# Patient Record
Sex: Female | Born: 1965 | Race: Black or African American | Hispanic: No | Marital: Married | Smoking: Never smoker
Health system: Southern US, Community
[De-identification: ages and names within clinical notes are randomized; demographics above are authoritative.]

## PROBLEM LIST (undated history)

## (undated) DIAGNOSIS — E079 Disorder of thyroid, unspecified: Secondary | ICD-10-CM

## (undated) DIAGNOSIS — R51 Headache: Secondary | ICD-10-CM

## (undated) DIAGNOSIS — I10 Essential (primary) hypertension: Secondary | ICD-10-CM

## (undated) HISTORY — DX: Headache: R51

## (undated) HISTORY — PX: HEMORROIDECTOMY: SUR656

---

## 2001-07-20 HISTORY — PX: VAGINAL HYSTERECTOMY: SUR661

## 2005-06-16 ENCOUNTER — Ambulatory Visit: Payer: Self-pay | Admitting: Gastroenterology

## 2005-07-09 ENCOUNTER — Ambulatory Visit: Payer: Self-pay | Admitting: Gastroenterology

## 2006-10-14 ENCOUNTER — Ambulatory Visit: Payer: Self-pay | Admitting: Gynecology

## 2007-09-30 ENCOUNTER — Emergency Department (HOSPITAL_COMMUNITY): Admission: EM | Admit: 2007-09-30 | Discharge: 2007-09-30 | Payer: Self-pay | Admitting: Emergency Medicine

## 2008-02-22 ENCOUNTER — Ambulatory Visit: Payer: Self-pay | Admitting: Gynecology

## 2008-04-25 ENCOUNTER — Encounter (HOSPITAL_BASED_OUTPATIENT_CLINIC_OR_DEPARTMENT_OTHER): Payer: Self-pay | Admitting: General Surgery

## 2008-04-25 ENCOUNTER — Ambulatory Visit (HOSPITAL_BASED_OUTPATIENT_CLINIC_OR_DEPARTMENT_OTHER): Admission: RE | Admit: 2008-04-25 | Discharge: 2008-04-25 | Payer: Self-pay | Admitting: General Surgery

## 2009-02-06 ENCOUNTER — Ambulatory Visit: Payer: Self-pay | Admitting: Obstetrics and Gynecology

## 2009-02-06 LAB — CONVERTED CEMR LAB
HCT: 40.8 % (ref 36.0–46.0)
Platelets: 179 10*3/uL (ref 150–400)
RBC: 4.58 M/uL (ref 3.87–5.11)
T3, Free: 3 pg/mL (ref 2.3–4.2)
TSH: 0.405 microintl units/mL (ref 0.350–4.500)
WBC: 7.7 10*3/uL (ref 4.0–10.5)

## 2009-02-07 ENCOUNTER — Encounter: Payer: Self-pay | Admitting: Obstetrics and Gynecology

## 2009-02-07 LAB — CONVERTED CEMR LAB
Trich, Wet Prep: NONE SEEN
Yeast Wet Prep HPF POC: NONE SEEN

## 2010-09-23 ENCOUNTER — Ambulatory Visit: Payer: 59 | Admitting: Obstetrics and Gynecology

## 2010-09-23 ENCOUNTER — Encounter: Payer: Self-pay | Admitting: Family Medicine

## 2010-09-23 DIAGNOSIS — R5383 Other fatigue: Secondary | ICD-10-CM

## 2010-09-23 DIAGNOSIS — R5381 Other malaise: Secondary | ICD-10-CM

## 2010-09-23 DIAGNOSIS — Z1272 Encounter for screening for malignant neoplasm of vagina: Secondary | ICD-10-CM

## 2010-09-23 DIAGNOSIS — Z01419 Encounter for gynecological examination (general) (routine) without abnormal findings: Secondary | ICD-10-CM

## 2010-09-23 LAB — CONVERTED CEMR LAB
ALT: 8 units/L (ref 0–35)
AST: 16 units/L (ref 0–37)
Alkaline Phosphatase: 51 units/L (ref 39–117)
Creatinine, Ser: 0.86 mg/dL (ref 0.40–1.20)
HCT: 39.7 % (ref 36.0–46.0)
MCV: 87.6 fL (ref 78.0–100.0)
Platelets: 219 10*3/uL (ref 150–400)
RDW: 12 % (ref 11.5–15.5)
Total Bilirubin: 0.8 mg/dL (ref 0.3–1.2)

## 2010-10-10 NOTE — Assessment & Plan Note (Signed)
Danielle Rodriguez, Danielle Rodriguez              ACCOUNT NO.:  [id number]  MEDICAL RECORD NO.:  [id number]          PATIENT TYPE:  LOCATION:  [facility] at [facility]           FACILITY:  PHYSICIAN:  Tinnie Gens, MD             DATE OF BIRTH:  DATE OF SERVICE:  09/23/2010                                 CLINIC NOTE  HISTORY OF PRESENT ILLNESS:  The patient is a 45 year old para 2 who underwent vaginal hysterectomy for menometrorrhagia in 2003.  She comes in today for her annual exam and desires to have blood work.  She wonders if she has UTI symptom.  She also feels weak and tired many times, wonders if her blood count to be low.  Does not have cycles anymore.  She denies blood in her stool or urine.  She complains of headache if she drinks too much of soda, otherwise feels well.  PAST MEDICAL HISTORY:  Significant for headache only.  PAST SURGICAL HISTORY:  She had a TVH and a hemorrhoidectomy.  MEDICATIONS:  None.  ALLERGIES:  METRONIDAZOLE.  OBSTETRICAL HISTORY:  She had 2 vaginal deliveries.  GYN HISTORY:  She had a TVH for menometrorrhagia, and no history of abnormal Pap smear.  SOCIAL HISTORY:  She works for [employer] from home.  She denies tobacco, alcohol, or drug use.  She has 2 boys, aged 49 and 3.  Her oldest has graduated from college.  Her youngest is currently in division 1 school in [place] playing football there.  FAMILY HISTORY:  She has a brother who has stage IV lung cancer.  He is a former smoker.  Paternal grandmother had breast cancer.  Maternal aunt who recently was deceased had some sort of cancer, and diabetes is a problem in her family as well.  Fourteen-point review of systems is reviewed.  She denies headache, fever, chills, nausea, vomiting, chest pain, shortness of breath, abdominal pain, diarrhea, constipation, blood in her stool, blood in her urine, swelling of feet or ankles.  No masses in her breasts.  PHYSICAL EXAMINATION:  GENERAL:  Today,  she is a well-developed well- nourished female in no acute distress. ABDOMEN:  Soft, nontender, nondistended. HEENT:  Normocephalic, atraumatic.  Sclerae anicteric. NECK:  Supple.  Normal thyroid. LUNGS:  Clear bilaterally. CV:  Regular rate and rhythm.  No rubs, gallops, or murmurs. ABDOMEN:  Soft, nontender, nondistended. EXTREMITIES:  No cyanosis, clubbing, or edema.  2+ distal pulses. BREASTS:  Symmetric with inverted nipples with no masses.  No supraclavicular or axillary adenopathy noted. GENITOURINARY:  Normal external female genitalia.  The uterus is normal. Vagina is pink and rugated.  Uterus and cervix are absent.  There is no pelvic masses noted on bimanual, though the ovaries were not felt as competent.  IMPRESSION:  GYN exam.  PLAN: 1. The patient is scheduled for mammogram already next this month.     Urine culture was done. 2. Lipid panel, CBC, CMP, and TSH were drawn today.          ______________________________ Tinnie Gens, MD    TP/MEDQ  D:  09/23/2010  T:  09/24/2010  Job:  [id number]

## 2010-12-02 NOTE — Assessment & Plan Note (Signed)
Danielle, Rodriguez              ACCOUNT NO.:  [id number]   MEDICAL RECORD NO.:  [id number]          PATIENT TYPE:  POB   LOCATION:  [facility] at [facility]         FACILITY:  [facility]   PHYSICIAN:  Ginger Carne, MD DATE OF BIRTH:  Nov 13, 1965   DATE OF SERVICE:  02/22/2008                                  CLINIC NOTE   Ms. Danielle Rodriguez is here today for routine gynecologic evaluation and  complaint of recurrent hemorrhoids.  She had a total vaginal  hysterectomy in 2002, because of menometrorrhagia.  She has no other GI  complaints apart from hemorrhoids.  No cardiac or GU complaints as well.  The patient takes no medications and has no ongoing medical problems.   SALIENT PHYSICAL FINDINGS:  VITAL SIGNS:  Blood pressure 135/81, weight  157 pounds, height 5 feet 5 inches, and pulse 77 and regular.  HEENT:  Grossly normal.  BREAST:  Without masses, discharge, thickenings or tenderness.  CHEST:  Clear to percussion and auscultation.  CARDIOVASCULAR:  Without murmurs or enlargements.  Regular rate and  rhythm.  EXTREMITY, LYMPHATIC, SKIN, NEUROLOGICAL, MUSCULOSKELETAL SYSTEMS:  Within normal limits.  ABDOMEN:  Soft without gross hepatosplenomegaly.  PELVIC:  EXTERNAL GENITALIA:  Vulva and vagina normal.  Cervix absent.  Both adnexa palpable found to be normal.  No apparent hemorrhoids noted  externally.  However, there is an area at 5 o'clock that appears to be a  boil on the patient states bleeds on a continual basis.   IMPRESSION:  Normal gynecologic exam.   PLAN:  The patient had a normal mammogram in April 2008 and will have  another scheduled in the spring of 2010.  She was asked to call for set  appointment.  In addition, she will be referred to [facility] for dealing with her hemorrhoids.           ______________________________  Ginger Carne, MD     SHB/MEDQ  D:  02/22/2008  T:  02/23/2008  Job:  [id number]

## 2010-12-02 NOTE — Assessment & Plan Note (Signed)
NAMEJAZIAH, Danielle Rodriguez              ACCOUNT NO.:  [id number]   MEDICAL RECORD NO.:  [id number]          PATIENT TYPE:  POB   LOCATION:  [facility] at [facility]         FACILITY:  [facility]   PHYSICIAN:  Argentina Donovan, MD        DATE OF BIRTH:  03-09-1966   DATE OF SERVICE:  02/06/2009                                  CLINIC NOTE   The patient is a 45 year old African American female who had vaginal  hysterectomy several years ago for menometrorrhagia, has been in good  health ever since.  Her chief complaint today are headaches and fatigue.  Her review of systems are negative with exception of those 2 things.  The patient, however, does have a problem of snoring.   PAST MEDICAL HISTORY:  Hysterectomy and banding, hemorrhoidectomy 1 year  ago.  She is due for mammogram today and has scheduled for that.   PHYSICAL EXAMINATION:  GENERAL:  A well-developed, well-nourished  African American female in no acute distress.  VITAL SIGNS:  Weight is 152, height 5 feet 5 inches, blood pressure  110/72, pulse 60 per minute.  HEENT:  Within normal limits.  Thyroid is symmetrical with no masses.  BACK:  Erect.  LUNGS:  Clear to auscultation and percussion.  HEART:  No murmur, normal sinus rhythm.  BREASTS:  Symmetrical, no dominant masses.  No supraclavicular or  axillary nodes.  ABDOMEN:  Soft, flat, nontender.  No masses or organomegaly.  No CVA  tenderness.  EXTREMITIES:  No edema.  Few spider veins, but no significant  varicosities.  NEUROLOGIC:  DTRs within normal limits.  GENITALIA:  External genitalia is normal.  BUS within normal limits.  Vagina is clean, well rugated with somewhat heavy creamy discharges.  Status hysterectomy at the apex, well healed.  Adnexa, no adnexal masses  palpated.  RECTAL:  Negative for masses.   IMPRESSION:  Normal physical examination.  Unilateral headaches of  periodic, often the patient wakes up with them.  We are going to have  her keep a calendar and then make  an appointment to see [facility] for  headache consultation, fatigue.  I am going to check CBC and get a  thyroid profile on the patient, but in view of the snoring, she may have  a problem with sleep apnea and I have told her if those are negative  that she probably will going to get a sleep apnea evaluation.  General  physical examination otherwise normal.           ______________________________  Argentina Donovan, MD     PR/MEDQ  D:  02/06/2009  T:  02/06/2009  Job:  [id number]

## 2010-12-02 NOTE — Op Note (Signed)
Danielle Rodriguez, Danielle Rodriguez              ACCOUNT NO.:  [id number]   MEDICAL RECORD NO.:  [id number]          PATIENT TYPE:  AMB   LOCATION:  [facility]                          FACILITY:  [facility]   PHYSICIAN:  Leonie Man, M.D.   DATE OF BIRTH:  05-Dec-1965   DATE OF PROCEDURE:  04/25/2008  DATE OF DISCHARGE:                               OPERATIVE REPORT   PREOPERATIVE DIAGNOSES:  1. Anal fistula.  2. External hemorrhoids.   POSTOPERATIVE DIAGNOSES:  1. Anal fistula.  2. External hemorrhoids.   PROCEDURES:  1. Anal fistulotomy.  2. External hemorrhoidectomy.   SURGEON:  Leonie Man, MD   ASSISTANT:  OR nurse.   ANESTHESIA:  General.   POSITION:  Prone.   SPECIMEN TO LABORATORY:  Hemorrhoids.   FINDINGS:  1. Superficial transsphincteric fistula.  2. External hemorrhoids.   COMPLICATIONS:  None.   The patient returned to the PACU in excellent condition.   Danielle Rodriguez is a 45 year old lady with persistent draining from the  perianal area.  She has no specific history of a perianal abscess.  On  examination, there is a fistula which traverses the external sphincter  at approximately 5% of the sphincter.  She also has some difficulty with  hygiene due to noninflamed external hemorrhoids, one in the posterior  midline and one in the anterior midline.  She comes to the operating  room now for fistulotomy and hemorrhoidectomy after the risks and  potential benefits of surgery have been fully discussed.  All questions  have been answered, consent for the surgery obtained.   Following the induction of satisfactory anesthesia with the patient in  the prone jackknife position, the perianal tissues were prepped and  draped to be included in a sterile operative field.  A surgical  precaution protocol identified the patient as Danielle Rodriguez and  procedures to be done as fistulotomy and hemorrhoidectomy, concurrence  from all members of the operating team was obtained.   I dilated  the anus up to 3 fingerbreadths at the external opening of the  fistula, which was located anteriorly, and laterally on the patient's  right side, I inserted a probe, which easily passed into the anal canal.  Using electrocautery, I opened up the fistula along the course of the  probe.  I cauterized the edges and marsupialized the edges of the  fistula to the base of the fistula with a running 3-0 chromic catgut  suture.   Attention then turned to the external hemorrhoids.  The larger of the  hemorrhoids, which was in the anterior midline was infiltrated with 0.5%  Marcaine with epinephrine.  I placed a suture at the base of the  hemorrhoid and just below the dentate line and suture ligated at its  base.  I used electrocautery to remove the hemorrhoid, dissected free  from the underlying external sphincter.  Hemostasis was obtained and the  resulting wound closed with a running suture of 3-0 chromic catgut.   Similarly, hemorrhoid on the posterior midline was similarly treated by  infiltrating with 0.5% Marcaine placing a suture at the base of the  hemorrhoid  and dissecting the hemorrhoid off of the external sphincter  and closing the wound with a running 3-0 chromic catgut.  All areas of  dissection checked for hemostasis and noted to be dry.  I placed a  Xeroform gauze over the now marsupialized fistulous tract.  I placed  Gelfoam gauze over the hemorrhoidal suture lines.  The anesthetic  reversed.  A sterile dressing placed across the anus, and the patient  was moved from the operating room to the recovery room in stable  condition.  She tolerated the procedure well.      Leonie Man, M.D.  Electronically Signed     PB/MEDQ  D:  04/25/2008  T:  04/25/2008  Job:  [id number]

## 2011-04-21 LAB — DIFFERENTIAL
Basophils Absolute: 0
Basophils Relative: 0
Eosinophils Relative: 2
Lymphocytes Relative: 28
Monocytes Absolute: 0.6

## 2011-04-21 LAB — POCT I-STAT, CHEM 8
BUN: 5 — ABNORMAL LOW
Calcium, Ion: 1.16
TCO2: 29

## 2011-04-21 LAB — CBC
HCT: 43
Hemoglobin: 15
MCHC: 34.8
MCV: 89.7
RDW: 12.7

## 2011-05-11 ENCOUNTER — Other Ambulatory Visit (INDEPENDENT_AMBULATORY_CARE_PROVIDER_SITE_OTHER): Payer: 59

## 2011-05-11 DIAGNOSIS — N390 Urinary tract infection, site not specified: Secondary | ICD-10-CM

## 2011-05-11 DIAGNOSIS — N760 Acute vaginitis: Secondary | ICD-10-CM

## 2011-05-11 DIAGNOSIS — A499 Bacterial infection, unspecified: Secondary | ICD-10-CM

## 2011-05-11 LAB — POCT URINALYSIS DIPSTICK
Ketones, UA: NEGATIVE
Protein, UA: NEGATIVE
pH, UA: NEGATIVE

## 2011-05-11 MED ORDER — LEVOFLOXACIN 500 MG PO TABS: 500.0000 mg | ORAL_TABLET | Freq: Every day | ORAL | Status: AC

## 2011-05-11 MED ORDER — CLINDAMYCIN PHOSPHATE 100 MG VA SUPP: 100.0000 mg | Freq: Every day | VAGINAL | Status: AC

## 2011-05-11 NOTE — Progress Notes (Signed)
Patient is here today for increased odor and low pelvic pressure with urination.  She also is having a slight discharge that has a foul odor.  Urine dip is showing 1+ blood and 2+ leukocytes.  Otherwise it is negative.  I will call in Cleocin gel as she is allergic to Metronidazole.  I will also call in Levaquin for her uti.

## 2011-05-11 NOTE — Progress Notes (Signed)
Addended by: Barbara Cower on: 05/11/2011 01:20 PM   Modules accepted: Orders

## 2011-05-11 NOTE — Progress Notes (Signed)
Addended by: Barbara Cower on: 05/11/2011 09:44 AM   Modules accepted: Orders

## 2011-10-27 ENCOUNTER — Ambulatory Visit (INDEPENDENT_AMBULATORY_CARE_PROVIDER_SITE_OTHER): Payer: 59 | Admitting: Family Medicine

## 2011-10-27 ENCOUNTER — Encounter: Payer: Self-pay | Admitting: Family Medicine

## 2011-10-27 DIAGNOSIS — Z0000 Encounter for general adult medical examination without abnormal findings: Secondary | ICD-10-CM

## 2011-10-27 DIAGNOSIS — R300 Dysuria: Secondary | ICD-10-CM

## 2011-10-27 LAB — POCT URINALYSIS DIPSTICK
Bilirubin, UA: NEGATIVE
Blood, UA: NEGATIVE
Glucose, UA: NEGATIVE
Ketones, UA: NEGATIVE
pH, UA: 6

## 2011-10-27 MED ORDER — SULFAMETHOXAZOLE-TMP DS 800-160 MG PO TABS: 1.0000 | ORAL_TABLET | Freq: Two times a day (BID) | ORAL | Status: DC

## 2011-10-27 NOTE — Patient Instructions (Signed)
Preventive Care for Adults, Female A healthy lifestyle and preventive care can promote health and wellness. Preventive health guidelines for women include the following key practices.  A routine yearly physical is a good way to check with your caregiver about your health and preventive screening. It is a chance to share any concerns and updates on your health, and to receive a thorough exam.   Visit your dentist for a routine exam and preventive care every 6 months. Brush your teeth twice a day and floss once a day. Good oral hygiene prevents tooth decay and gum disease.   The frequency of eye exams is based on your age, health, family medical history, use of contact lenses, and other factors. Follow your caregiver's recommendations for frequency of eye exams.   Eat a healthy diet. Foods like vegetables, fruits, whole grains, low-fat dairy products, and lean protein foods contain the nutrients you need without too many calories. Decrease your intake of foods high in solid fats, added sugars, and salt. Eat the right amount of calories for you.Get information about a proper diet from your caregiver, if necessary.   Regular physical exercise is one of the most important things you can do for your health. Most adults should get at least 150 minutes of moderate-intensity exercise (any activity that increases your heart rate and causes you to sweat) each week. In addition, most adults need muscle-strengthening exercises on 2 or more days a week.   Maintain a healthy weight. The body mass index (BMI) is a screening tool to identify possible weight problems. It provides an estimate of body fat based on height and weight. Your caregiver can help determine your BMI, and can help you achieve or maintain a healthy weight.For adults 20 years and older:   A BMI below 18.5 is considered underweight.   A BMI of 18.5 to 24.9 is normal.   A BMI of 25 to 29.9 is considered overweight.   A BMI of 30 and above is  considered obese.   Maintain normal blood lipids and cholesterol levels by exercising and minimizing your intake of saturated fat. Eat a balanced diet with plenty of fruit and vegetables. Blood tests for lipids and cholesterol should begin at age 20 and be repeated every 5 years. If your lipid or cholesterol levels are high, you are over 50, or you are at high risk for heart disease, you may need your cholesterol levels checked more frequently.Ongoing high lipid and cholesterol levels should be treated with medicines if diet and exercise are not effective.   If you smoke, find out from your caregiver how to quit. If you do not use tobacco, do not start.   If you are pregnant, do not drink alcohol. If you are breastfeeding, be very cautious about drinking alcohol. If you are not pregnant and choose to drink alcohol, do not exceed 1 drink per day. One drink is considered to be 12 ounces (355 mL) of beer, 5 ounces (148 mL) of wine, or 1.5 ounces (44 mL) of liquor.   Avoid use of street drugs. Do not share needles with anyone. Ask for help if you need support or instructions about stopping the use of drugs.   High blood pressure causes heart disease and increases the risk of stroke. Your blood pressure should be checked at least every 1 to 2 years. Ongoing high blood pressure should be treated with medicines if weight loss and exercise are not effective.   If you are 55 to 48   years old, ask your caregiver if you should take aspirin to prevent strokes.   Diabetes screening involves taking a blood sample to check your fasting blood sugar level. This should be done once every 3 years, after age 45, if you are within normal weight and without risk factors for diabetes. Testing should be considered at a younger age or be carried out more frequently if you are overweight and have at least 1 risk factor for diabetes.   Breast cancer screening is essential preventive care for women. You should practice "breast  self-awareness." This means understanding the normal appearance and feel of your breasts and may include breast self-examination. Any changes detected, no matter how small, should be reported to a caregiver. Women in their 20s and 30s should have a clinical breast exam (CBE) by a caregiver as part of a regular health exam every 1 to 3 years. After age 40, women should have a CBE every year. Starting at age 40, women should consider having a mammography (breast X-ray test) every year. Women who have a family history of breast cancer should talk to their caregiver about genetic screening. Women at a high risk of breast cancer should talk to their caregivers about having magnetic resonance imaging (MRI) and a mammography every year.   The Pap test is a screening test for cervical cancer. A Pap test can show cell changes on the cervix that might become cervical cancer if left untreated. A Pap test is a procedure in which cells are obtained and examined from the lower end of the uterus (cervix).   Women should have a Pap test starting at age 21.   Between ages 21 and 29, Pap tests should be repeated every 2 years.   Beginning at age 30, you should have a Pap test every 3 years as long as the past 3 Pap tests have been normal.   Some women have medical problems that increase the chance of getting cervical cancer. Talk to your caregiver about these problems. It is especially important to talk to your caregiver if a new problem develops soon after your last Pap test. In these cases, your caregiver may recommend more frequent screening and Pap tests.   The above recommendations are the same for women who have or have not gotten the vaccine for human papillomavirus (HPV).   If you had a hysterectomy for a problem that was not cancer or a condition that could lead to cancer, then you no longer need Pap tests. Even if you no longer need a Pap test, a regular exam is a good idea to make sure no other problems are  starting.   If you are between ages 65 and 70, and you have had normal Pap tests going back 10 years, you no longer need Pap tests. Even if you no longer need a Pap test, a regular exam is a good idea to make sure no other problems are starting.   If you have had past treatment for cervical cancer or a condition that could lead to cancer, you need Pap tests and screening for cancer for at least 20 years after your treatment.   If Pap tests have been discontinued, risk factors (such as a new sexual partner) need to be reassessed to determine if screening should be resumed.   The HPV test is an additional test that may be used for cervical cancer screening. The HPV test looks for the virus that can cause the cell changes on the cervix.   The cells collected during the Pap test can be tested for HPV. The HPV test could be used to screen women aged 30 years and older, and should be used in women of any age who have unclear Pap test results. After the age of 30, women should have HPV testing at the same frequency as a Pap test.   Colorectal cancer can be detected and often prevented. Most routine colorectal cancer screening begins at the age of 50 and continues through age 75. However, your caregiver may recommend screening at an earlier age if you have risk factors for colon cancer. On a yearly basis, your caregiver may provide home test kits to check for hidden blood in the stool. Use of a small camera at the end of a tube, to directly examine the colon (sigmoidoscopy or colonoscopy), can detect the earliest forms of colorectal cancer. Talk to your caregiver about this at age 50, when routine screening begins. Direct examination of the colon should be repeated every 5 to 10 years through age 75, unless early forms of pre-cancerous polyps or small growths are found.   Hepatitis C blood testing is recommended for all people born from 1945 through 1965 and any individual with known risks for hepatitis C.    Practice safe sex. Use condoms and avoid high-risk sexual practices to reduce the spread of sexually transmitted infections (STIs). STIs include gonorrhea, chlamydia, syphilis, trichomonas, herpes, HPV, and human immunodeficiency virus (HIV). Herpes, HIV, and HPV are viral illnesses that have no cure. They can result in disability, cancer, and death. Sexually active women aged 25 and younger should be checked for chlamydia. Older women with new or multiple partners should also be tested for chlamydia. Testing for other STIs is recommended if you are sexually active and at increased risk.   Osteoporosis is a disease in which the bones lose minerals and strength with aging. This can result in serious bone fractures. The risk of osteoporosis can be identified using a bone density scan. Women ages 65 and over and women at risk for fractures or osteoporosis should discuss screening with their caregivers. Ask your caregiver whether you should take a calcium supplement or vitamin D to reduce the rate of osteoporosis.   Menopause can be associated with physical symptoms and risks. Hormone replacement therapy is available to decrease symptoms and risks. You should talk to your caregiver about whether hormone replacement therapy is right for you.   Use sunscreen with sun protection factor (SPF) of 30 or more. Apply sunscreen liberally and repeatedly throughout the day. You should seek shade when your shadow is shorter than you. Protect yourself by wearing long sleeves, pants, a wide-brimmed hat, and sunglasses year round, whenever you are outdoors.   Once a month, do a whole body skin exam, using a mirror to look at the skin on your back. Notify your caregiver of new moles, moles that have irregular borders, moles that are larger than a pencil eraser, or moles that have changed in shape or color.   Stay current with required immunizations.   Influenza. You need a dose every fall (or winter). The composition of  the flu vaccine changes each year, so being vaccinated once is not enough.   Pneumococcal polysaccharide. You need 1 to 2 doses if you smoke cigarettes or if you have certain chronic medical conditions. You need 1 dose at age 65 (or older) if you have never been vaccinated.   Tetanus, diphtheria, pertussis (Tdap, Td). Get 1 dose of   Tdap vaccine if you are younger than age 65, are over 65 and have contact with an infant, are a healthcare worker, are pregnant, or simply want to be protected from whooping cough. After that, you need a Td booster dose every 10 years. Consult your caregiver if you have not had at least 3 tetanus and diphtheria-containing shots sometime in your life or have a deep or dirty wound.   HPV. You need this vaccine if you are a woman age 26 or younger. The vaccine is given in 3 doses over 6 months.   Measles, mumps, rubella (MMR). You need at least 1 dose of MMR if you were born in 1957 or later. You may also need a second dose.   Meningococcal. If you are age 19 to 21 and a first-year college student living in a residence hall, or have one of several medical conditions, you need to get vaccinated against meningococcal disease. You may also need additional booster doses.   Zoster (shingles). If you are age 60 or older, you should get this vaccine.   Varicella (chickenpox). If you have never had chickenpox or you were vaccinated but received only 1 dose, talk to your caregiver to find out if you need this vaccine.   Hepatitis A. You need this vaccine if you have a specific risk factor for hepatitis A virus infection or you simply wish to be protected from this disease. The vaccine is usually given as 2 doses, 6 to 18 months apart.   Hepatitis B. You need this vaccine if you have a specific risk factor for hepatitis B virus infection or you simply wish to be protected from this disease. The vaccine is given in 3 doses, usually over 6 months.  Preventive Services /  Frequency Ages 19 to 39  Blood pressure check.** / Every 1 to 2 years.   Lipid and cholesterol check.** / Every 5 years beginning at age 20.   Clinical breast exam.** / Every 3 years for women in their 20s and 30s.   Pap test.** / Every 2 years from ages 21 through 29. Every 3 years starting at age 30 through age 65 or 70 with a history of 3 consecutive normal Pap tests.   HPV screening.** / Every 3 years from ages 30 through ages 65 to 70 with a history of 3 consecutive normal Pap tests.   Hepatitis C blood test.** / For any individual with known risks for hepatitis C.   Skin self-exam. / Monthly.   Influenza immunization.** / Every year.   Pneumococcal polysaccharide immunization.** / 1 to 2 doses if you smoke cigarettes or if you have certain chronic medical conditions.   Tetanus, diphtheria, pertussis (Tdap, Td) immunization. / A one-time dose of Tdap vaccine. After that, you need a Td booster dose every 10 years.   HPV immunization. / 3 doses over 6 months, if you are 26 and younger.   Measles, mumps, rubella (MMR) immunization. / You need at least 1 dose of MMR if you were born in 1957 or later. You may also need a second dose.   Meningococcal immunization. / 1 dose if you are age 19 to 21 and a first-year college student living in a residence hall, or have one of several medical conditions, you need to get vaccinated against meningococcal disease. You may also need additional booster doses.   Varicella immunization.** / Consult your caregiver.   Hepatitis A immunization.** / Consult your caregiver. 2 doses, 6 to 18 months   apart.   Hepatitis B immunization.** / Consult your caregiver. 3 doses usually over 6 months.  Ages 40 to 64  Blood pressure check.** / Every 1 to 2 years.   Lipid and cholesterol check.** / Every 5 years beginning at age 20.   Clinical breast exam.** / Every year after age 40.   Mammogram.** / Every year beginning at age 40 and continuing for as  long as you are in good health. Consult with your caregiver.   Pap test.** / Every 3 years starting at age 30 through age 65 or 70 with a history of 3 consecutive normal Pap tests.   HPV screening.** / Every 3 years from ages 30 through ages 65 to 70 with a history of 3 consecutive normal Pap tests.   Fecal occult blood test (FOBT) of stool. / Every year beginning at age 50 and continuing until age 75. You may not need to do this test if you get a colonoscopy every 10 years.   Flexible sigmoidoscopy or colonoscopy.** / Every 5 years for a flexible sigmoidoscopy or every 10 years for a colonoscopy beginning at age 50 and continuing until age 75.   Hepatitis C blood test.** / For all people born from 1945 through 1965 and any individual with known risks for hepatitis C.   Skin self-exam. / Monthly.   Influenza immunization.** / Every year.   Pneumococcal polysaccharide immunization.** / 1 to 2 doses if you smoke cigarettes or if you have certain chronic medical conditions.   Tetanus, diphtheria, pertussis (Tdap, Td) immunization.** / A one-time dose of Tdap vaccine. After that, you need a Td booster dose every 10 years.   Measles, mumps, rubella (MMR) immunization. / You need at least 1 dose of MMR if you were born in 1957 or later. You may also need a second dose.   Varicella immunization.** / Consult your caregiver.   Meningococcal immunization.** / Consult your caregiver.   Hepatitis A immunization.** / Consult your caregiver. 2 doses, 6 to 18 months apart.   Hepatitis B immunization.** / Consult your caregiver. 3 doses, usually over 6 months.  Ages 65 and over  Blood pressure check.** / Every 1 to 2 years.   Lipid and cholesterol check.** / Every 5 years beginning at age 20.   Clinical breast exam.** / Every year after age 40.   Mammogram.** / Every year beginning at age 40 and continuing for as long as you are in good health. Consult with your caregiver.   Pap test.** /  Every 3 years starting at age 30 through age 65 or 70 with a 3 consecutive normal Pap tests. Testing can be stopped between 65 and 70 with 3 consecutive normal Pap tests and no abnormal Pap or HPV tests in the past 10 years.   HPV screening.** / Every 3 years from ages 30 through ages 65 or 70 with a history of 3 consecutive normal Pap tests. Testing can be stopped between 65 and 70 with 3 consecutive normal Pap tests and no abnormal Pap or HPV tests in the past 10 years.   Fecal occult blood test (FOBT) of stool. / Every year beginning at age 50 and continuing until age 75. You may not need to do this test if you get a colonoscopy every 10 years.   Flexible sigmoidoscopy or colonoscopy.** / Every 5 years for a flexible sigmoidoscopy or every 10 years for a colonoscopy beginning at age 50 and continuing until age 75.   Hepatitis   C blood test.** / For all people born from 1945 through 1965 and any individual with known risks for hepatitis C.   Osteoporosis screening.** / A one-time screening for women ages 65 and over and women at risk for fractures or osteoporosis.   Skin self-exam. / Monthly.   Influenza immunization.** / Every year.   Pneumococcal polysaccharide immunization.** / 1 dose at age 65 (or older) if you have never been vaccinated.   Tetanus, diphtheria, pertussis (Tdap, Td) immunization. / A one-time dose of Tdap vaccine if you are over 65 and have contact with an infant, are a healthcare worker, or simply want to be protected from whooping cough. After that, you need a Td booster dose every 10 years.   Varicella immunization.** / Consult your caregiver.   Meningococcal immunization.** / Consult your caregiver.   Hepatitis A immunization.** / Consult your caregiver. 2 doses, 6 to 18 months apart.   Hepatitis B immunization.** / Check with your caregiver. 3 doses, usually over 6 months.  ** Family history and personal history of risk and conditions may change your caregiver's  recommendations. Document Released: 09/01/2001 Document Revised: 06/25/2011 Document Reviewed: 12/01/2010 [employer] Patient Information 2012 [employer]. 

## 2011-10-27 NOTE — Progress Notes (Signed)
Addended by: Barbara Cower on: 10/27/2011 05:13 PM   Modules accepted: Orders

## 2011-10-27 NOTE — Progress Notes (Signed)
  Subjective:     Danielle Rodriguez is a 46 y.o. female and is here for a comprehensive physical exam. The patient reports no problems. C/o burning with urination. No h/o abnl pap and s/p TVH. History   Social History  . Marital Status: Married    Spouse Name: N/A    Number of Children: N/A  . Years of Education: N/A   Occupational History  . Not on file.   Social History Main Topics  . Smoking status: Never Smoker   . Smokeless tobacco: Not on file  . Alcohol Use: No  . Drug Use: No  . Sexually Active: Yes -- Female partner(s)    Birth Control/ Protection: Surgical     hysterectomy   Other Topics Concern  . Not on file   Social History Narrative  . No narrative on file   Health Maintenance  Topic Date Due  . Pap Smear  11/27/1981  . Tetanus/tdap  11/28/1982  . Influenza Vaccine  04/19/2012    The following portions of the patient's history were reviewed and updated as appropriate: allergies, current medications, past family history, past medical history, past social history, past surgical history and problem list.  Review of Systems A comprehensive review of systems was negative.   Objective:    BP 124/72  Pulse 71  Ht 5\' 3"  (1.6 m)  Wt 163 lb (73.936 kg)  BMI 28.87 kg/m2 General appearance: alert, cooperative and appears stated age Head: Normocephalic, without obvious abnormality, atraumatic Neck: no adenopathy, supple, symmetrical, trachea midline and thyroid not enlarged, symmetric, no tenderness/mass/nodules Lungs: clear to auscultation bilaterally Breasts: normal appearance, no masses or tenderness Heart: regular rate and rhythm, S1, S2 normal, no murmur, click, rub or gallop Abdomen: soft, non-tender; bowel sounds normal; no masses,  no organomegaly Pelvic: external genitalia normal, no adnexal masses or tenderness, uterus surgically absent and vagina normal without discharge Extremities: extremities normal, atraumatic, no cyanosis or edema Pulses: 2+ and  symmetric Skin: Skin color, texture, turgor normal. No rashes or lesions Lymph nodes: Cervical, supraclavicular, and axillary nodes normal. Neurologic: Grossly normal   UA shows nitrite and LE--will send for culture Assessment:    Healthy female exam.      Plan:    Mammogram Annual blood work No pap needed. See After Visit Summary for Counseling Recommendations

## 2011-10-28 LAB — CBC
HCT: 39.7 % (ref 36.0–46.0)
Hemoglobin: 12.9 g/dL (ref 12.0–15.0)
RBC: 4.39 MIL/uL (ref 3.87–5.11)
WBC: 6.8 10*3/uL (ref 4.0–10.5)

## 2011-10-28 LAB — COMPREHENSIVE METABOLIC PANEL
Albumin: 4.5 g/dL (ref 3.5–5.2)
BUN: 11 mg/dL (ref 6–23)
CO2: 29 mEq/L (ref 19–32)
Calcium: 9 mg/dL (ref 8.4–10.5)
Chloride: 103 mEq/L (ref 96–112)
Glucose, Bld: 96 mg/dL (ref 70–99)
Potassium: 3.9 mEq/L (ref 3.5–5.3)

## 2011-10-28 LAB — LIPID PANEL
HDL: 68 mg/dL (ref >39)
Triglycerides: 76 mg/dL (ref <150)

## 2011-10-28 LAB — TSH: TSH: 0.611 u[IU]/mL (ref 0.350–4.500)

## 2011-10-31 LAB — URINE CULTURE: Colony Count: >=100000

## 2012-06-29 ENCOUNTER — Ambulatory Visit: Payer: 59 | Admitting: Obstetrics and Gynecology

## 2012-06-29 ENCOUNTER — Encounter: Payer: Self-pay | Admitting: Obstetrics and Gynecology

## 2012-06-29 ENCOUNTER — Ambulatory Visit (INDEPENDENT_AMBULATORY_CARE_PROVIDER_SITE_OTHER): Payer: 59 | Admitting: Obstetrics and Gynecology

## 2012-06-29 VITALS — BP 145/88 | HR 71 | Ht 64.0 in | Wt 165.0 lb

## 2012-06-29 DIAGNOSIS — N760 Acute vaginitis: Secondary | ICD-10-CM

## 2012-06-29 DIAGNOSIS — B9689 Other specified bacterial agents as the cause of diseases classified elsewhere: Secondary | ICD-10-CM

## 2012-06-29 DIAGNOSIS — R300 Dysuria: Secondary | ICD-10-CM

## 2012-06-29 DIAGNOSIS — A499 Bacterial infection, unspecified: Secondary | ICD-10-CM

## 2012-06-29 DIAGNOSIS — N390 Urinary tract infection, site not specified: Secondary | ICD-10-CM

## 2012-06-29 LAB — POCT URINALYSIS DIPSTICK
Bilirubin, UA: NEGATIVE
Glucose, UA: NEGATIVE
Nitrite, UA: POSITIVE
Urobilinogen, UA: NEGATIVE
pH, UA: 6

## 2012-06-29 MED ORDER — SULFAMETHOXAZOLE-TMP DS 800-160 MG PO TABS: 1.0000 | ORAL_TABLET | Freq: Two times a day (BID) | ORAL | Status: AC

## 2012-06-29 NOTE — Progress Notes (Signed)
Patient ID: Danielle Rodriguez, female   DOB: Dec 22, 1965, 46 y.o.   MRN: [id number] 46 yo G2P2 presenting today for evaluation of dark urine and frequency. Patient denies dysuria.  UA is positive for nitrite  Patient has had two positive E.coli UTI since 04/2011- Reminded patient of good perineal hygiene and to keep herself well hydrated.  Will Prescribe bactrim. Cultures sent.

## 2012-07-01 LAB — CULTURE, URINE COMPREHENSIVE

## 2012-09-29 ENCOUNTER — Other Ambulatory Visit (INDEPENDENT_AMBULATORY_CARE_PROVIDER_SITE_OTHER): Payer: 59

## 2012-09-29 ENCOUNTER — Telehealth: Payer: Self-pay | Admitting: *Deleted

## 2012-09-29 DIAGNOSIS — R300 Dysuria: Secondary | ICD-10-CM

## 2012-09-29 LAB — POCT URINALYSIS DIPSTICK
Bilirubin, UA: NEGATIVE
Glucose, UA: NEGATIVE
Ketones, UA: NEGATIVE
Leukocytes, UA: NEGATIVE
Nitrite, UA: POSITIVE
pH, UA: 5

## 2012-09-29 NOTE — Telephone Encounter (Signed)
Called pt to adv still waiting on urine culture but will call in abx per Dr. Shawnie Pons. Pt states not pregnant and not allergic to Cipro - I adv will call into Cipro into [facility] in [place] [employer] - she will pick up

## 2012-09-29 NOTE — Telephone Encounter (Signed)
Called [facility] - Spoke to "pharmacist" gave order. 500mg  Cipro bid x3days

## 2012-10-01 LAB — CULTURE, URINE COMPREHENSIVE

## 2012-10-31 ENCOUNTER — Ambulatory Visit (INDEPENDENT_AMBULATORY_CARE_PROVIDER_SITE_OTHER): Payer: 59 | Admitting: Obstetrics & Gynecology

## 2012-10-31 ENCOUNTER — Encounter: Payer: Self-pay | Admitting: Obstetrics & Gynecology

## 2012-10-31 VITALS — BP 134/82 | HR 90 | Resp 16 | Ht 64.0 in | Wt 161.0 lb

## 2012-10-31 DIAGNOSIS — N390 Urinary tract infection, site not specified: Secondary | ICD-10-CM

## 2012-10-31 DIAGNOSIS — Z01419 Encounter for gynecological examination (general) (routine) without abnormal findings: Secondary | ICD-10-CM

## 2012-10-31 DIAGNOSIS — Z139 Encounter for screening, unspecified: Secondary | ICD-10-CM

## 2012-10-31 DIAGNOSIS — Z0000 Encounter for general adult medical examination without abnormal findings: Secondary | ICD-10-CM

## 2012-10-31 LAB — POCT URINALYSIS DIPSTICK
Bilirubin, UA: NEGATIVE
Blood, UA: NEGATIVE
Glucose, UA: NEGATIVE
Ketones, UA: NEGATIVE
Protein, UA: 0.2
Spec Grav, UA: 1.02
pH, UA: 5

## 2012-10-31 NOTE — Progress Notes (Signed)
Subjective:    Danielle Rodriguez is a 47 y.o. female who presents for an annual exam. The patient has no complaints today. The patient is sexually active. GYN screening history: last pap: was normal. The patient wears seatbelts: yes. The patient participates in regular exercise: no. Has the patient ever been transfused or tattooed?: no. The patient reports that there is not domestic violence in her life.   Menstrual History: OB History   Grav Para Term Preterm Abortions TAB SAB Ect Mult Living   2 2 2       2       Menarche age: 74  No LMP recorded. Patient has had a hysterectomy.    The following portions of the patient's history were reviewed and updated as appropriate: allergies, current medications, past family history, past medical history, past social history, past surgical history and problem list.  Review of Systems A comprehensive review of systems was negative. Married for 14 years, denies dyspareunia, no need for lubricant, denies hot flashes. 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 She has 70 and 36 yo sons. She had a mammogram today.   Objective:    BP 134/82  Pulse 90  Resp 16  Ht 5\' 4"  (1.626 m)  Wt 161 lb (73.029 kg)  BMI 27.62 kg/m2  General Appearance:    Alert, cooperative, no distress, appears stated age  Head:    Normocephalic, without obvious abnormality, atraumatic  Eyes:    PERRL, conjunctiva/corneas clear, EOM's intact, fundi    benign, both eyes  Ears:    Normal TM's and external ear canals, both ears  Nose:   Nares normal, septum midline, mucosa normal, no drainage    or sinus tenderness  Throat:   Lips, mucosa, and tongue normal; teeth and gums normal  Neck:   Supple, symmetrical, trachea midline, no adenopathy;    thyroid:  no enlargement/tenderness/nodules; no carotid   bruit or JVD  Back:     Symmetric, no curvature, ROM normal, no CVA tenderness  Lungs:     Clear to auscultation bilaterally, respirations unlabored  Chest Wall:    No  tenderness or deformity   Heart:    Regular rate and rhythm, S1 and S2 normal, no murmur, rub   or gallop  Breast Exam:    No tenderness, masses, or nipple abnormality  Abdomen:     Soft, non-tender, bowel sounds active all four quadrants,    no masses, no organomegaly  Genitalia:    Normal female without lesion, discharge or tenderness, Normal vaginal discharge, normal adnexal exam     Extremities:   Extremities normal, atraumatic, no cyanosis or edema  Pulses:   2+ and symmetric all extremities  Skin:   Skin color, texture, turgor normal, no rashes or lesions  Lymph nodes:   Cervical, supraclavicular, and axillary nodes normal  Neurologic:   CNII-XII intact, normal strength, sensation and reflexes    throughout  .    Assessment:    Healthy female exam.  Recurrent UTIs   Plan:     Breast self exam technique reviewed and patient encouraged to perform self-exam monthly.  Lab tests when she is fasting Urology referral for UTI recurrence

## 2013-12-22 ENCOUNTER — Encounter: Payer: Self-pay | Admitting: Family Medicine

## 2013-12-22 ENCOUNTER — Ambulatory Visit (INDEPENDENT_AMBULATORY_CARE_PROVIDER_SITE_OTHER): Payer: 59 | Admitting: Family Medicine

## 2013-12-22 VITALS — BP 137/82 | HR 70 | Ht 64.0 in | Wt 159.2 lb

## 2013-12-22 DIAGNOSIS — R8290 Unspecified abnormal findings in urine: Secondary | ICD-10-CM

## 2013-12-22 DIAGNOSIS — R82998 Other abnormal findings in urine: Secondary | ICD-10-CM

## 2013-12-22 DIAGNOSIS — N390 Urinary tract infection, site not specified: Secondary | ICD-10-CM

## 2013-12-22 DIAGNOSIS — N898 Other specified noninflammatory disorders of vagina: Secondary | ICD-10-CM

## 2013-12-22 DIAGNOSIS — Z01419 Encounter for gynecological examination (general) (routine) without abnormal findings: Secondary | ICD-10-CM

## 2013-12-22 LAB — POCT URINALYSIS DIPSTICK
Bilirubin, UA: NEGATIVE
GLUCOSE UA: NEGATIVE
Ketones, UA: ABSENT
NITRITE UA: POSITIVE
PROTEIN UA: ABSENT
Spec Grav, UA: >=1.03
UROBILINOGEN UA: NEGATIVE
pH, UA: 6

## 2013-12-22 LAB — CBC
HCT: 39.2 % (ref 36.0–46.0)
Hemoglobin: 13.7 g/dL (ref 12.0–15.0)
MCH: 29.8 pg (ref 26.0–34.0)
MCHC: 34.9 g/dL (ref 30.0–36.0)
MCV: 85.2 fL (ref 78.0–100.0)
PLATELETS: 186 10*3/uL (ref 150–400)
RBC: 4.6 MIL/uL (ref 3.87–5.11)
RDW: 13.7 % (ref 11.5–15.5)
WBC: 8.1 10*3/uL (ref 4.0–10.5)

## 2013-12-22 MED ORDER — CIPROFLOXACIN HCL 500 MG PO TABS: 500.0000 mg | ORAL_TABLET | Freq: Two times a day (BID) | ORAL | Status: DC

## 2013-12-22 MED ORDER — FLUCONAZOLE 150 MG PO TABS: 150.0000 mg | ORAL_TABLET | Freq: Every day | ORAL | Status: DC

## 2013-12-22 NOTE — Progress Notes (Signed)
Patient is having increased odor with urine and also having increased cottage cheese like discharge.  She is here today for a yearly exam as well.  No other concerns.

## 2013-12-22 NOTE — Progress Notes (Signed)
  Subjective:     Danielle Rodriguez is a 48 y.o. female and is here for a comprehensive physical exam. The patient reports problems - urinary odor and vaginal discharge. Previous hysterectomy for bleeding.  No cervix is left.  Having hot flashes.  History   Social History  . Marital Status: Married    Spouse Name: N/A    Number of Children: N/A  . Years of Education: N/A   Occupational History  . Not on file.   Social History Main Topics  . Smoking status: Never Smoker   . Smokeless tobacco: Never Used  . Alcohol Use: No  . Drug Use: No  . Sexual Activity: Yes    Partners: Male    Birth Control/ Protection: Surgical     Comment: hysterectomy   Other Topics Concern  . Not on file   Social History Narrative  . No narrative on file   Health Maintenance  Topic Date Due  . Pap Smear  11/27/1981  . Tetanus/tdap  11/28/1982  . Mammogram  11/27/2013  . Colonoscopy  11/27/2013  . Influenza Vaccine  02/17/2014    The following portions of the patient's history were reviewed and updated as appropriate: allergies, current medications, past family history, past medical history, past social history, past surgical history and problem list.  Review of Systems Pertinent items are noted in HPI.   Objective:    BP 137/82  Pulse 70  Ht 5\' 4"  (1.626 m)  Wt 159 lb 3.2 oz (72.213 kg)  BMI 27.31 kg/m2 General appearance: alert, cooperative and appears stated age Head: Normocephalic, without obvious abnormality, atraumatic Neck: no adenopathy, supple, symmetrical, trachea midline and thyroid not enlarged, symmetric, no tenderness/mass/nodules Lungs: clear to auscultation bilaterally Breasts: normal appearance, no masses or tenderness Heart: regular rate and rhythm, S1, S2 normal, no murmur, click, rub or gallop Abdomen: soft, non-tender; bowel sounds normal; no masses,  no organomegaly Pelvic: external genitalia normal and vaginal discharge noted, cervix and uterus are surgically  absent, no adnexal mass or tenderness Extremities: extremities normal, atraumatic, no cyanosis or edema Pulses: 2+ and symmetric Skin: Skin color, texture, turgor normal. No rashes or lesions Lymph nodes: Cervical, supraclavicular, and axillary nodes normal. Neurologic: Grossly normal    Urinalysis    Component Value Date/Time   BILIRUBINUR negative 12/22/2013 0949   PROTEINUR absent 12/22/2013 0949   UROBILINOGEN negative 12/22/2013 0949   NITRITE positive 12/22/2013 0949   LEUKOCYTESUR moderate (2+) 12/22/2013 0949      Assessment:    Healthy female exam. Probable UTI     Plan:  No need for further paps. Annual blood work Urine culture [employer] prep Annual mammograms.   See After Visit Summary for Counseling Recommendations

## 2013-12-23 LAB — COMPREHENSIVE METABOLIC PANEL
ALK PHOS: 48 U/L (ref 39–117)
AST: 12 U/L (ref 0–37)
Albumin: 4.4 g/dL (ref 3.5–5.2)
BILIRUBIN TOTAL: 0.9 mg/dL (ref 0.2–1.2)
BUN: 8 mg/dL (ref 6–23)
CO2: 27 meq/L (ref 19–32)
CREATININE: 0.83 mg/dL (ref 0.50–1.10)
Calcium: 9.4 mg/dL (ref 8.4–10.5)
Chloride: 103 mEq/L (ref 96–112)
GLUCOSE: 97 mg/dL (ref 70–99)
Potassium: 4.2 mEq/L (ref 3.5–5.3)
SODIUM: 137 meq/L (ref 135–145)
TOTAL PROTEIN: 6.7 g/dL (ref 6.0–8.3)

## 2013-12-23 LAB — WET PREP, GENITAL
Trich, Wet Prep: NONE SEEN
WBC, Wet Prep HPF POC: NONE SEEN
Yeast Wet Prep HPF POC: NONE SEEN

## 2013-12-23 LAB — LIPID PANEL
CHOL/HDL RATIO: 2.5 ratio
CHOLESTEROL: 189 mg/dL (ref 0–200)
HDL: 76 mg/dL (ref >39)
LDL Cholesterol: 102 mg/dL — ABNORMAL HIGH (ref 0–99)
Triglycerides: 57 mg/dL (ref <150)
VLDL: 11 mg/dL (ref 0–40)

## 2013-12-23 LAB — TSH: TSH: 0.445 u[IU]/mL (ref 0.350–4.500)

## 2013-12-24 LAB — CULTURE, URINE COMPREHENSIVE

## 2013-12-25 ENCOUNTER — Telehealth: Payer: Self-pay | Admitting: *Deleted

## 2013-12-25 NOTE — Telephone Encounter (Signed)
I have called in Clindamycin vaginal gel to patients pharmacy.  Pt aware.

## 2013-12-26 ENCOUNTER — Telehealth: Payer: Self-pay | Admitting: *Deleted

## 2013-12-26 NOTE — Telephone Encounter (Signed)
Message copied by Barbara Cower on Tue Dec 26, 2013  2:35 PM ------      Message from: Reva Bores      Created: Mon Dec 25, 2013  8:06 AM       normal labs please inform pt.--pt. Does have UTI--should be on appropriate abx.  Also with BV--would need Flagyl 500 mg bid--i think we treated presumptively for yeast. ------

## 2013-12-26 NOTE — Telephone Encounter (Signed)
Patient was notified of results by Chrissie yesterday.

## 2014-05-21 ENCOUNTER — Encounter: Payer: Self-pay | Admitting: Family Medicine

## 2014-09-17 ENCOUNTER — Telehealth: Payer: Self-pay | Admitting: *Deleted

## 2014-09-17 DIAGNOSIS — N898 Other specified noninflammatory disorders of vagina: Secondary | ICD-10-CM

## 2014-09-17 MED ORDER — FLUCONAZOLE 150 MG PO TABS: 150.0000 mg | ORAL_TABLET | Freq: Every day | ORAL | Status: DC

## 2014-09-17 NOTE — Telephone Encounter (Signed)
Patient is requesting a refill of Diflucan for yeast infection. 

## 2014-09-20 ENCOUNTER — Ambulatory Visit: Payer: Self-pay | Admitting: Family Medicine

## 2015-01-15 ENCOUNTER — Ambulatory Visit: Payer: Self-pay | Admitting: Family Medicine

## 2015-01-22 ENCOUNTER — Ambulatory Visit (INDEPENDENT_AMBULATORY_CARE_PROVIDER_SITE_OTHER): Payer: 59 | Admitting: Family Medicine

## 2015-01-22 ENCOUNTER — Encounter: Payer: Self-pay | Admitting: Family Medicine

## 2015-01-22 VITALS — BP 134/80 | HR 67 | Ht 64.0 in | Wt 162.0 lb

## 2015-01-22 DIAGNOSIS — Z01419 Encounter for gynecological examination (general) (routine) without abnormal findings: Secondary | ICD-10-CM | POA: Diagnosis not present

## 2015-01-22 DIAGNOSIS — N898 Other specified noninflammatory disorders of vagina: Secondary | ICD-10-CM

## 2015-01-22 LAB — CBC
HEMATOCRIT: 42 % (ref 36.0–46.0)
HEMOGLOBIN: 14.1 g/dL (ref 12.0–15.0)
MCH: 29.4 pg (ref 26.0–34.0)
MCHC: 33.6 g/dL (ref 30.0–36.0)
MCV: 87.5 fL (ref 78.0–100.0)
MPV: 9.8 fL (ref 8.6–12.4)
Platelets: 190 10*3/uL (ref 150–400)
RBC: 4.8 MIL/uL (ref 3.87–5.11)
RDW: 13.5 % (ref 11.5–15.5)
WBC: 6.7 10*3/uL (ref 4.0–10.5)

## 2015-01-22 NOTE — Progress Notes (Signed)
Here today for yearly exam.  Has had hysterectomy.  Having some vaginal discharge. Noticed increase in bloating. Wants yearly labs drawn today.

## 2015-01-22 NOTE — Progress Notes (Signed)
  Subjective:     Danielle Rodriguez is a 49 y.o. female and is here for a comprehensive physical exam. The patient reports problems - vaginal discharge. Previous hysterectomy. Having some hot flashes..  History   Social History  . Marital Status: Married    Spouse Name: N/A  . Number of Children: N/A  . Years of Education: N/A   Occupational History  . Not on file.   Social History Main Topics  . Smoking status: Never Smoker   . Smokeless tobacco: Never Used  . Alcohol Use: No  . Drug Use: No  . Sexual Activity:    Partners: Male    Birth Control/ Protection: Surgical     Comment: hysterectomy   Other Topics Concern  . Not on file   Social History Narrative   Health Maintenance  Topic Date Due  . HIV Screening  11/28/1978  . PAP SMEAR  11/27/1981  . TETANUS/TDAP  11/28/1982  . MAMMOGRAM  11/27/2013  . COLONOSCOPY  11/27/2013  . INFLUENZA VACCINE  02/18/2015    The following portions of the patient's history were reviewed and updated as appropriate: allergies, current medications, past family history, past medical history, past social history, past surgical history and problem list.  Review of Systems Pertinent items are noted in HPI.   Objective:    BP 134/80 mmHg  Pulse 67  Ht 5\' 4"  (1.626 m)  Wt 162 lb (73.483 kg)  BMI 27.79 kg/m2 General appearance: alert, cooperative and appears stated age Head: Normocephalic, without obvious abnormality, atraumatic Neck: no adenopathy, supple, symmetrical, trachea midline and thyroid not enlarged, symmetric, no tenderness/mass/nodules Lungs: clear to auscultation bilaterally Breasts: normal appearance, no masses or tenderness Heart: regular rate and rhythm, S1, S2 normal, no murmur, click, rub or gallop Abdomen: soft, non-tender; bowel sounds normal; no masses,  no organomegaly Pelvic: external genitalia normal, no adnexal masses or tenderness, no cervical motion tenderness, uterus surgically absent and vagina normal  without discharge Extremities: extremities normal, atraumatic, no cyanosis or edema Pulses: 2+ and symmetric Skin: Skin color, texture, turgor normal. No rashes or lesions Lymph nodes: Cervical, supraclavicular, and axillary nodes normal. Neurologic: Grossly normal    Assessment:    Healthy female exam.      Plan:      Problem List Items Addressed This Visit    None    Visit Diagnoses    Encounter for routine gynecological examination    -  Primary    Relevant Orders    TSH    CBC    Lipid panel    Comprehensive metabolic panel    MM DIGITAL SCREENING BILATERAL    Ambulatory referral to Gastroenterology    Vaginal discharge        Relevant Orders    Wet prep, genital       See After Visit Summary for Counseling Recommendations

## 2015-01-22 NOTE — Patient Instructions (Signed)
Preventive Care for Adults A healthy lifestyle and preventive care can promote health and wellness. Preventive health guidelines for women include the following key practices.  A routine yearly physical is a good way to check with your health care provider about your health and preventive screening. It is a chance to share any concerns and updates on your health and to receive a thorough exam.  Visit your dentist for a routine exam and preventive care every 6 months. Brush your teeth twice a day and floss once a day. Good oral hygiene prevents tooth decay and gum disease.  The frequency of eye exams is based on your age, health, family medical history, use of contact lenses, and other factors. Follow your health care provider's recommendations for frequency of eye exams.  Eat a healthy diet. Foods like vegetables, fruits, whole grains, low-fat dairy products, and lean protein foods contain the nutrients you need without too many calories. Decrease your intake of foods high in solid fats, added sugars, and salt. Eat the right amount of calories for you.Get information about a proper diet from your health care provider, if necessary.  Regular physical exercise is one of the most important things you can do for your health. Most adults should get at least 150 minutes of moderate-intensity exercise (any activity that increases your heart rate and causes you to sweat) each week. In addition, most adults need muscle-strengthening exercises on 2 or more days a week.  Maintain a healthy weight. The body mass index (BMI) is a screening tool to identify possible weight problems. It provides an estimate of body fat based on height and weight. Your health care provider can find your BMI and can help you achieve or maintain a healthy weight.For adults 20 years and older:  A BMI below 18.5 is considered underweight.  A BMI of 18.5 to 24.9 is normal.  A BMI of 25 to 29.9 is considered overweight.  A BMI of  30 and above is considered obese.  Maintain normal blood lipids and cholesterol levels by exercising and minimizing your intake of saturated fat. Eat a balanced diet with plenty of fruit and vegetables. Blood tests for lipids and cholesterol should begin at age 20 and be repeated every 5 years. If your lipid or cholesterol levels are high, you are over 50, or you are at high risk for heart disease, you may need your cholesterol levels checked more frequently.Ongoing high lipid and cholesterol levels should be treated with medicines if diet and exercise are not working.  If you smoke, find out from your health care provider how to quit. If you do not use tobacco, do not start.  Lung cancer screening is recommended for adults aged 55-80 years who are at high risk for developing lung cancer because of a history of smoking. A yearly low-dose CT scan of the lungs is recommended for people who have at least a 30-pack-year history of smoking and are a current smoker or have quit within the past 15 years. A pack year of smoking is smoking an average of 1 pack of cigarettes a day for 1 year (for example: 1 pack a day for 30 years or 2 packs a day for 15 years). Yearly screening should continue until the smoker has stopped smoking for at least 15 years. Yearly screening should be stopped for people who develop a health problem that would prevent them from having lung cancer treatment.  If you are pregnant, do not drink alcohol. If you are breastfeeding,   be very cautious about drinking alcohol. If you are not pregnant and choose to drink alcohol, do not have more than 1 drink per day. One drink is considered to be 12 ounces (355 mL) of beer, 5 ounces (148 mL) of wine, or 1.5 ounces (44 mL) of liquor.  Avoid use of street drugs. Do not share needles with anyone. Ask for help if you need support or instructions about stopping the use of drugs.  High blood pressure causes heart disease and increases the risk of  stroke. Your blood pressure should be checked at least every 1 to 2 years. Ongoing high blood pressure should be treated with medicines if weight loss and exercise do not work.  If you are 75-52 years old, ask your health care provider if you should take aspirin to prevent strokes.  Diabetes screening involves taking a blood sample to check your fasting blood sugar level. This should be done once every 3 years, after age 15, if you are within normal weight and without risk factors for diabetes. Testing should be considered at a younger age or be carried out more frequently if you are overweight and have at least 1 risk factor for diabetes.  Breast cancer screening is essential preventive care for women. You should practice "breast self-awareness." This means understanding the normal appearance and feel of your breasts and may include breast self-examination. Any changes detected, no matter how small, should be reported to a health care provider. Women in their 58s and 30s should have a clinical breast exam (CBE) by a health care provider as part of a regular health exam every 1 to 3 years. After age 16, women should have a CBE every year. Starting at age 53, women should consider having a mammogram (breast X-ray test) every year. Women who have a family history of breast cancer should talk to their health care provider about genetic screening. Women at a high risk of breast cancer should talk to their health care providers about having an MRI and a mammogram every year.  Breast cancer gene (BRCA)-related cancer risk assessment is recommended for women who have family members with BRCA-related cancers. BRCA-related cancers include breast, ovarian, tubal, and peritoneal cancers. Having family members with these cancers may be associated with an increased risk for harmful changes (mutations) in the breast cancer genes BRCA1 and BRCA2. Results of the assessment will determine the need for genetic counseling and  BRCA1 and BRCA2 testing.  Routine pelvic exams to screen for cancer are no longer recommended for nonpregnant women who are considered low risk for cancer of the pelvic organs (ovaries, uterus, and vagina) and who do not have symptoms. Ask your health care provider if a screening pelvic exam is right for you.  If you have had past treatment for cervical cancer or a condition that could lead to cancer, you need Pap tests and screening for cancer for at least 20 years after your treatment. If Pap tests have been discontinued, your risk factors (such as having a new sexual partner) need to be reassessed to determine if screening should be resumed. Some women have medical problems that increase the chance of getting cervical cancer. In these cases, your health care provider may recommend more frequent screening and Pap tests.  The HPV test is an additional test that may be used for cervical cancer screening. The HPV test looks for the virus that can cause the cell changes on the cervix. The cells collected during the Pap test can be  tested for HPV. The HPV test could be used to screen women aged 30 years and older, and should be used in women of any age who have unclear Pap test results. After the age of 30, women should have HPV testing at the same frequency as a Pap test.  Colorectal cancer can be detected and often prevented. Most routine colorectal cancer screening begins at the age of 50 years and continues through age 75 years. However, your health care provider may recommend screening at an earlier age if you have risk factors for colon cancer. On a yearly basis, your health care provider may provide home test kits to check for hidden blood in the stool. Use of a small camera at the end of a tube, to directly examine the colon (sigmoidoscopy or colonoscopy), can detect the earliest forms of colorectal cancer. Talk to your health care provider about this at age 50, when routine screening begins. Direct  exam of the colon should be repeated every 5-10 years through age 75 years, unless early forms of pre-cancerous polyps or small growths are found.  People who are at an increased risk for hepatitis B should be screened for this virus. You are considered at high risk for hepatitis B if:  You were born in a country where hepatitis B occurs often. Talk with your health care provider about which countries are considered high risk.  Your parents were born in a high-risk country and you have not received a shot to protect against hepatitis B (hepatitis B vaccine).  You have HIV or AIDS.  You use needles to inject street drugs.  You live with, or have sex with, someone who has hepatitis B.  You get hemodialysis treatment.  You take certain medicines for conditions like cancer, organ transplantation, and autoimmune conditions.  Hepatitis C blood testing is recommended for all people born from 1945 through 1965 and any individual with known risks for hepatitis C.  Practice safe sex. Use condoms and avoid high-risk sexual practices to reduce the spread of sexually transmitted infections (STIs). STIs include gonorrhea, chlamydia, syphilis, trichomonas, herpes, HPV, and human immunodeficiency virus (HIV). Herpes, HIV, and HPV are viral illnesses that have no cure. They can result in disability, cancer, and death.  You should be screened for sexually transmitted illnesses (STIs) including gonorrhea and chlamydia if:  You are sexually active and are younger than 24 years.  You are older than 24 years and your health care provider tells you that you are at risk for this type of infection.  Your sexual activity has changed since you were last screened and you are at an increased risk for chlamydia or gonorrhea. Ask your health care provider if you are at risk.  If you are at risk of being infected with HIV, it is recommended that you take a prescription medicine daily to prevent HIV infection. This is  called preexposure prophylaxis (PrEP). You are considered at risk if:  You are a heterosexual woman, are sexually active, and are at increased risk for HIV infection.  You take drugs by injection.  You are sexually active with a partner who has HIV.  Talk with your health care provider about whether you are at high risk of being infected with HIV. If you choose to begin PrEP, you should first be tested for HIV. You should then be tested every 3 months for as long as you are taking PrEP.  Osteoporosis is a disease in which the bones lose minerals and strength   with aging. This can result in serious bone fractures or breaks. The risk of osteoporosis can be identified using a bone density scan. Women ages 65 years and over and women at risk for fractures or osteoporosis should discuss screening with their health care providers. Ask your health care provider whether you should take a calcium supplement or vitamin D to reduce the rate of osteoporosis.  Menopause can be associated with physical symptoms and risks. Hormone replacement therapy is available to decrease symptoms and risks. You should talk to your health care provider about whether hormone replacement therapy is right for you.  Use sunscreen. Apply sunscreen liberally and repeatedly throughout the day. You should seek shade when your shadow is shorter than you. Protect yourself by wearing long sleeves, pants, a wide-brimmed hat, and sunglasses year round, whenever you are outdoors.  Once a month, do a whole body skin exam, using a mirror to look at the skin on your back. Tell your health care provider of new moles, moles that have irregular borders, moles that are larger than a pencil eraser, or moles that have changed in shape or color.  Stay current with required vaccines (immunizations).  Influenza vaccine. All adults should be immunized every year.  Tetanus, diphtheria, and acellular pertussis (Td, Tdap) vaccine. Pregnant women should  receive 1 dose of Tdap vaccine during each pregnancy. The dose should be obtained regardless of the length of time since the last dose. Immunization is preferred during the 27th-36th week of gestation. An adult who has not previously received Tdap or who does not know her vaccine status should receive 1 dose of Tdap. This initial dose should be followed by tetanus and diphtheria toxoids (Td) booster doses every 10 years. Adults with an unknown or incomplete history of completing a 3-dose immunization series with Td-containing vaccines should begin or complete a primary immunization series including a Tdap dose. Adults should receive a Td booster every 10 years.  Varicella vaccine. An adult without evidence of immunity to varicella should receive 2 doses or a second dose if she has previously received 1 dose. Pregnant females who do not have evidence of immunity should receive the first dose after pregnancy. This first dose should be obtained before leaving the health care facility. The second dose should be obtained 4-8 weeks after the first dose.  Human papillomavirus (HPV) vaccine. Females aged 13-26 years who have not received the vaccine previously should obtain the 3-dose series. The vaccine is not recommended for use in pregnant females. However, pregnancy testing is not needed before receiving a dose. If a female is found to be pregnant after receiving a dose, no treatment is needed. In that case, the remaining doses should be delayed until after the pregnancy. Immunization is recommended for any person with an immunocompromised condition through the age of 26 years if she did not get any or all doses earlier. During the 3-dose series, the second dose should be obtained 4-8 weeks after the first dose. The third dose should be obtained 24 weeks after the first dose and 16 weeks after the second dose.  Zoster vaccine. One dose is recommended for adults aged 60 years or older unless certain conditions are  present.  Measles, mumps, and rubella (MMR) vaccine. Adults born before 1957 generally are considered immune to measles and mumps. Adults born in 1957 or later should have 1 or more doses of MMR vaccine unless there is a contraindication to the vaccine or there is laboratory evidence of immunity to   each of the three diseases. A routine second dose of MMR vaccine should be obtained at least 28 days after the first dose for students attending postsecondary schools, health care workers, or international travelers. People who received inactivated measles vaccine or an unknown type of measles vaccine during 1963-1967 should receive 2 doses of MMR vaccine. People who received inactivated mumps vaccine or an unknown type of mumps vaccine before 1979 and are at high risk for mumps infection should consider immunization with 2 doses of MMR vaccine. For females of childbearing age, rubella immunity should be determined. If there is no evidence of immunity, females who are not pregnant should be vaccinated. If there is no evidence of immunity, females who are pregnant should delay immunization until after pregnancy. Unvaccinated health care workers born before 1957 who lack laboratory evidence of measles, mumps, or rubella immunity or laboratory confirmation of disease should consider measles and mumps immunization with 2 doses of MMR vaccine or rubella immunization with 1 dose of MMR vaccine.  Pneumococcal 13-valent conjugate (PCV13) vaccine. When indicated, a person who is uncertain of her immunization history and has no record of immunization should receive the PCV13 vaccine. An adult aged 19 years or older who has certain medical conditions and has not been previously immunized should receive 1 dose of PCV13 vaccine. This PCV13 should be followed with a dose of pneumococcal polysaccharide (PPSV23) vaccine. The PPSV23 vaccine dose should be obtained at least 8 weeks after the dose of PCV13 vaccine. An adult aged 19  years or older who has certain medical conditions and previously received 1 or more doses of PPSV23 vaccine should receive 1 dose of PCV13. The PCV13 vaccine dose should be obtained 1 or more years after the last PPSV23 vaccine dose.  Pneumococcal polysaccharide (PPSV23) vaccine. When PCV13 is also indicated, PCV13 should be obtained first. All adults aged 65 years and older should be immunized. An adult younger than age 65 years who has certain medical conditions should be immunized. Any person who resides in a nursing home or long-term care facility should be immunized. An adult smoker should be immunized. People with an immunocompromised condition and certain other conditions should receive both PCV13 and PPSV23 vaccines. People with human immunodeficiency virus (HIV) infection should be immunized as soon as possible after diagnosis. Immunization during chemotherapy or radiation therapy should be avoided. Routine use of PPSV23 vaccine is not recommended for American Indians, [place] Natives, or people younger than 65 years unless there are medical conditions that require PPSV23 vaccine. When indicated, people who have unknown immunization and have no record of immunization should receive PPSV23 vaccine. One-time revaccination 5 years after the first dose of PPSV23 is recommended for people aged 19-64 years who have chronic kidney failure, nephrotic syndrome, asplenia, or immunocompromised conditions. People who received 1-2 doses of PPSV23 before age 65 years should receive another dose of PPSV23 vaccine at age 65 years or later if at least 5 years have passed since the previous dose. Doses of PPSV23 are not needed for people immunized with PPSV23 at or after age 65 years.  Meningococcal vaccine. Adults with asplenia or persistent complement component deficiencies should receive 2 doses of quadrivalent meningococcal conjugate (MenACWY-D) vaccine. The doses should be obtained at least 2 months apart.  Microbiologists working with certain meningococcal bacteria, military recruits, people at risk during an outbreak, and people who travel to or live in countries with a high rate of meningitis should be immunized. A first-year college student up through age   21 years who is living in a residence hall should receive a dose if she did not receive a dose on or after her 16th birthday. Adults who have certain high-risk conditions should receive one or more doses of vaccine.  Hepatitis A vaccine. Adults who wish to be protected from this disease, have certain high-risk conditions, work with hepatitis A-infected animals, work in hepatitis A research labs, or travel to or work in countries with a high rate of hepatitis A should be immunized. Adults who were previously unvaccinated and who anticipate close contact with an international adoptee during the first 60 days after arrival in the [place] from a country with a high rate of hepatitis A should be immunized.  Hepatitis B vaccine. Adults who wish to be protected from this disease, have certain high-risk conditions, may be exposed to blood or other infectious body fluids, are household contacts or sex partners of hepatitis B positive people, are clients or workers in certain care facilities, or travel to or work in countries with a high rate of hepatitis B should be immunized.  Haemophilus influenzae type b (Hib) vaccine. A previously unvaccinated person with asplenia or sickle cell disease or having a scheduled splenectomy should receive 1 dose of Hib vaccine. Regardless of previous immunization, a recipient of a hematopoietic stem cell transplant should receive a 3-dose series 6-12 months after her successful transplant. Hib vaccine is not recommended for adults with HIV infection. Preventive Services / Frequency Ages 64 to 68 years  Blood pressure check.** / Every 1 to 2 years.  Lipid and cholesterol check.** / Every 5 years beginning at age  22.  Clinical breast exam.** / Every 3 years for women in their 88s and 53s.  BRCA-related cancer risk assessment.** / For women who have family members with a BRCA-related cancer (breast, ovarian, tubal, or peritoneal cancers).  Pap test.** / Every 2 years from ages 90 through 51. Every 3 years starting at age 21 through age 56 or 3 with a history of 3 consecutive normal Pap tests.  HPV screening.** / Every 3 years from ages 24 through ages 1 to 46 with a history of 3 consecutive normal Pap tests.  Hepatitis C blood test.** / For any individual with known risks for hepatitis C.  Skin self-exam. / Monthly.  Influenza vaccine. / Every year.  Tetanus, diphtheria, and acellular pertussis (Tdap, Td) vaccine.** / Consult your health care provider. Pregnant women should receive 1 dose of Tdap vaccine during each pregnancy. 1 dose of Td every 10 years.  Varicella vaccine.** / Consult your health care provider. Pregnant females who do not have evidence of immunity should receive the first dose after pregnancy.  HPV vaccine. / 3 doses over 6 months, if 72 and younger. The vaccine is not recommended for use in pregnant females. However, pregnancy testing is not needed before receiving a dose.  Measles, mumps, rubella (MMR) vaccine.** / You need at least 1 dose of MMR if you were born in 1957 or later. You may also need a 2nd dose. For females of childbearing age, rubella immunity should be determined. If there is no evidence of immunity, females who are not pregnant should be vaccinated. If there is no evidence of immunity, females who are pregnant should delay immunization until after pregnancy.  Pneumococcal 13-valent conjugate (PCV13) vaccine.** / Consult your health care provider.  Pneumococcal polysaccharide (PPSV23) vaccine.** / 1 to 2 doses if you smoke cigarettes or if you have certain conditions.  Meningococcal vaccine.** /  1 dose if you are age 19 to 21 years and a first-year college  student living in a residence hall, or have one of several medical conditions, you need to get vaccinated against meningococcal disease. You may also need additional booster doses.  Hepatitis A vaccine.** / Consult your health care provider.  Hepatitis B vaccine.** / Consult your health care provider.  Haemophilus influenzae type b (Hib) vaccine.** / Consult your health care provider. Ages 40 to 64 years  Blood pressure check.** / Every 1 to 2 years.  Lipid and cholesterol check.** / Every 5 years beginning at age 20 years.  Lung cancer screening. / Every year if you are aged 55-80 years and have a 30-pack-year history of smoking and currently smoke or have quit within the past 15 years. Yearly screening is stopped once you have quit smoking for at least 15 years or develop a health problem that would prevent you from having lung cancer treatment.  Clinical breast exam.** / Every year after age 40 years.  BRCA-related cancer risk assessment.** / For women who have family members with a BRCA-related cancer (breast, ovarian, tubal, or peritoneal cancers).  Mammogram.** / Every year beginning at age 40 years and continuing for as long as you are in good health. Consult with your health care provider.  Pap test.** / Every 3 years starting at age 30 years through age 65 or 70 years with a history of 3 consecutive normal Pap tests.  HPV screening.** / Every 3 years from ages 30 years through ages 65 to 70 years with a history of 3 consecutive normal Pap tests.  Fecal occult blood test (FOBT) of stool. / Every year beginning at age 50 years and continuing until age 75 years. You may not need to do this test if you get a colonoscopy every 10 years.  Flexible sigmoidoscopy or colonoscopy.** / Every 5 years for a flexible sigmoidoscopy or every 10 years for a colonoscopy beginning at age 50 years and continuing until age 75 years.  Hepatitis C blood test.** / For all people born from 1945 through  1965 and any individual with known risks for hepatitis C.  Skin self-exam. / Monthly.  Influenza vaccine. / Every year.  Tetanus, diphtheria, and acellular pertussis (Tdap/Td) vaccine.** / Consult your health care provider. Pregnant women should receive 1 dose of Tdap vaccine during each pregnancy. 1 dose of Td every 10 years.  Varicella vaccine.** / Consult your health care provider. Pregnant females who do not have evidence of immunity should receive the first dose after pregnancy.  Zoster vaccine.** / 1 dose for adults aged 60 years or older.  Measles, mumps, rubella (MMR) vaccine.** / You need at least 1 dose of MMR if you were born in 1957 or later. You may also need a 2nd dose. For females of childbearing age, rubella immunity should be determined. If there is no evidence of immunity, females who are not pregnant should be vaccinated. If there is no evidence of immunity, females who are pregnant should delay immunization until after pregnancy.  Pneumococcal 13-valent conjugate (PCV13) vaccine.** / Consult your health care provider.  Pneumococcal polysaccharide (PPSV23) vaccine.** / 1 to 2 doses if you smoke cigarettes or if you have certain conditions.  Meningococcal vaccine.** / Consult your health care provider.  Hepatitis A vaccine.** / Consult your health care provider.  Hepatitis B vaccine.** / Consult your health care provider.  Haemophilus influenzae type b (Hib) vaccine.** / Consult your health care provider. Ages 65   years and over  Blood pressure check.** / Every 1 to 2 years.  Lipid and cholesterol check.** / Every 5 years beginning at age 22 years.  Lung cancer screening. / Every year if you are aged 73-80 years and have a 30-pack-year history of smoking and currently smoke or have quit within the past 15 years. Yearly screening is stopped once you have quit smoking for at least 15 years or develop a health problem that would prevent you from having lung cancer  treatment.  Clinical breast exam.** / Every year after age 4 years.  BRCA-related cancer risk assessment.** / For women who have family members with a BRCA-related cancer (breast, ovarian, tubal, or peritoneal cancers).  Mammogram.** / Every year beginning at age 40 years and continuing for as long as you are in good health. Consult with your health care provider.  Pap test.** / Every 3 years starting at age 9 years through age 34 or 91 years with 3 consecutive normal Pap tests. Testing can be stopped between 65 and 70 years with 3 consecutive normal Pap tests and no abnormal Pap or HPV tests in the past 10 years.  HPV screening.** / Every 3 years from ages 57 years through ages 64 or 45 years with a history of 3 consecutive normal Pap tests. Testing can be stopped between 65 and 70 years with 3 consecutive normal Pap tests and no abnormal Pap or HPV tests in the past 10 years.  Fecal occult blood test (FOBT) of stool. / Every year beginning at age 15 years and continuing until age 17 years. You may not need to do this test if you get a colonoscopy every 10 years.  Flexible sigmoidoscopy or colonoscopy.** / Every 5 years for a flexible sigmoidoscopy or every 10 years for a colonoscopy beginning at age 86 years and continuing until age 71 years.  Hepatitis C blood test.** / For all people born from [birth year] through 1967 and any individual with known risks for hepatitis C.  Osteoporosis screening.** / A one-time screening for women ages 83 years and over and women at risk for fractures or osteoporosis.  Skin self-exam. / Monthly.  Influenza vaccine. / Every year.  Tetanus, diphtheria, and acellular pertussis (Tdap/Td) vaccine.** / 1 dose of Td every 10 years.  Varicella vaccine.** / Consult your health care provider.  Zoster vaccine.** / 1 dose for adults aged 61 years or older.  Pneumococcal 13-valent conjugate (PCV13) vaccine.** / Consult your health care provider.  Pneumococcal  polysaccharide (PPSV23) vaccine.** / 1 dose for all adults aged 28 years and older.  Meningococcal vaccine.** / Consult your health care provider.  Hepatitis A vaccine.** / Consult your health care provider.  Hepatitis B vaccine.** / Consult your health care provider.  Haemophilus influenzae type b (Hib) vaccine.** / Consult your health care provider. ** Family history and personal history of risk and conditions may change your health care provider's recommendations. Document Released: 09/01/2001 Document Revised: 11/20/2013 Document Reviewed: 12/01/2010 [facility] Patient Information 2015 [place], [place]. This information is not intended to replace advice given to you by your health care provider. Make sure you discuss any questions you have with your health care provider.

## 2015-01-23 ENCOUNTER — Telehealth: Payer: Self-pay | Admitting: *Deleted

## 2015-01-23 LAB — COMPREHENSIVE METABOLIC PANEL
ALBUMIN: 4.6 g/dL (ref 3.5–5.2)
ALT: 13 U/L (ref 0–35)
AST: 15 U/L (ref 0–37)
Alkaline Phosphatase: 58 U/L (ref 39–117)
BUN: 12 mg/dL (ref 6–23)
CHLORIDE: 105 meq/L (ref 96–112)
CO2: 26 mEq/L (ref 19–32)
Calcium: 9.5 mg/dL (ref 8.4–10.5)
Creat: 0.92 mg/dL (ref 0.50–1.10)
GLUCOSE: 100 mg/dL — AB (ref 70–99)
POTASSIUM: 4.3 meq/L (ref 3.5–5.3)
Sodium: 141 mEq/L (ref 135–145)
TOTAL PROTEIN: 7.1 g/dL (ref 6.0–8.3)
Total Bilirubin: 0.6 mg/dL (ref 0.2–1.2)

## 2015-01-23 LAB — LIPID PANEL
CHOLESTEROL: 206 mg/dL — AB (ref 0–200)
HDL: 95 mg/dL (ref >=46)
LDL Cholesterol: 100 mg/dL — ABNORMAL HIGH (ref 0–99)
TRIGLYCERIDES: 57 mg/dL (ref <150)
Total CHOL/HDL Ratio: 2.2 Ratio
VLDL: 11 mg/dL (ref 0–40)

## 2015-01-23 LAB — WET PREP, GENITAL
CLUE CELLS WET PREP: NONE SEEN
Trich, Wet Prep: NONE SEEN
Yeast Wet Prep HPF POC: NONE SEEN

## 2015-01-23 LAB — TSH: TSH: 0.44 u[IU]/mL (ref 0.350–4.500)

## 2015-02-10 ENCOUNTER — Emergency Department (HOSPITAL_COMMUNITY): Payer: 59

## 2015-02-10 ENCOUNTER — Emergency Department (HOSPITAL_COMMUNITY)
Admission: EM | Admit: 2015-02-10 | Discharge: 2015-02-10 | Disposition: A | Discharge status: Home / Self Care | Payer: 59 | Attending: Emergency Medicine | Admitting: Emergency Medicine

## 2015-02-10 ENCOUNTER — Encounter (HOSPITAL_COMMUNITY): Payer: Self-pay | Admitting: *Deleted

## 2015-02-10 DIAGNOSIS — R0602 Shortness of breath: Secondary | ICD-10-CM | POA: Diagnosis present

## 2015-02-10 DIAGNOSIS — R0789 Other chest pain: Secondary | ICD-10-CM | POA: Insufficient documentation

## 2015-02-10 DIAGNOSIS — M546 Pain in thoracic spine: Secondary | ICD-10-CM | POA: Diagnosis not present

## 2015-02-10 LAB — URINALYSIS, ROUTINE W REFLEX MICROSCOPIC
Bilirubin Urine: NEGATIVE
Glucose, UA: NEGATIVE mg/dL
Hgb urine dipstick: NEGATIVE
KETONES UR: NEGATIVE mg/dL
NITRITE: NEGATIVE
Protein, ur: NEGATIVE mg/dL
SPECIFIC GRAVITY, URINE: 1.02 (ref 1.005–1.030)
UROBILINOGEN UA: 0.2 mg/dL (ref 0.0–1.0)
pH: 6 (ref 5.0–8.0)

## 2015-02-10 LAB — I-STAT TROPONIN, ED
TROPONIN I, POC: 0 ng/mL (ref 0.00–0.08)
Troponin i, poc: 0 ng/mL (ref 0.00–0.08)

## 2015-02-10 LAB — I-STAT CHEM 8, ED
BUN: 12 mg/dL (ref 6–20)
CALCIUM ION: 1.26 mmol/L — AB (ref 1.12–1.23)
CHLORIDE: 102 mmol/L (ref 101–111)
Creatinine, Ser: 0.9 mg/dL (ref 0.44–1.00)
Glucose, Bld: 100 mg/dL — ABNORMAL HIGH (ref 65–99)
HCT: 43 % (ref 36.0–46.0)
Hemoglobin: 14.6 g/dL (ref 12.0–15.0)
Potassium: 3.6 mmol/L (ref 3.5–5.1)
SODIUM: 141 mmol/L (ref 135–145)
TCO2: 25 mmol/L (ref 0–100)

## 2015-02-10 LAB — D-DIMER, QUANTITATIVE (NOT AT ARMC): D-Dimer, Quant: <0.27 ug/mL-FEU (ref 0.00–0.48)

## 2015-02-10 LAB — URINE MICROSCOPIC-ADD ON

## 2015-02-10 MED ORDER — OXYCODONE-ACETAMINOPHEN 5-325 MG PO TABS: ORAL_TABLET | ORAL | Status: DC

## 2015-02-10 MED ORDER — DIAZEPAM 5 MG PO TABS
5.0000 mg | ORAL_TABLET | Freq: Once | ORAL | Status: AC
  Administered 2015-02-10: 5 mg via ORAL
  Filled 2015-02-10: qty 1

## 2015-02-10 MED ORDER — OXYCODONE-ACETAMINOPHEN 5-325 MG PO TABS
1.0000 | ORAL_TABLET | Freq: Once | ORAL | Status: AC
  Administered 2015-02-10: 1 via ORAL
  Filled 2015-02-10: qty 1

## 2015-02-10 MED ORDER — METHOCARBAMOL 500 MG PO TABS: 1000.0000 mg | ORAL_TABLET | Freq: Four times a day (QID) | ORAL | Status: DC | PRN

## 2015-02-10 MED ORDER — NAPROXEN 250 MG PO TABS: 250.0000 mg | ORAL_TABLET | Freq: Two times a day (BID) | ORAL | Status: DC | PRN

## 2015-02-10 NOTE — ED Provider Notes (Signed)
CSN: [id number]     Arrival date & time 02/10/15  1010 History   First MD Initiated Contact with Patient 02/10/15 1028     Chief Complaint  Patient presents with  . Shortness of Breath      HPI  Pt was seen at 1035.  Per pt, c/o gradual onset and persistence of constant left thoracic back and left upper chest wall "pain" that began this morning. Pt states she was standing and "bent over to wash my feet" with her left hand, before the pain began. Pt states she stood up and then "felt like I can't take a deep breath" due to the pain. Pt states the pain "feels like a muscle spasm." Pt also c/o "cloudy urine first thing in the morning" for the past 2 weeks. Denies dysuria/hematuria, no vaginal bleeding/discharge, no abd pain, no flank pain, no N/V/D, no cough, no focal motor weakness, no tingling/numbness in extremities, no rash, no fevers.    Past Medical History  Diagnosis Date  . [id number](010.2)    Past Surgical History  Procedure Laterality Date  . Hemorroidectomy    . Vaginal hysterectomy  2003   Family History  Problem Relation Age of Onset  . Diabetes Mother   . Hypertension Mother   . Cancer Brother 50    LUNG CANCER  . Cancer Maternal Aunt   . Cancer Paternal Grandmother     BREAST  . Cancer Cousin   . Cancer Maternal Uncle    History  Substance Use Topics  . Smoking status: Never Smoker   . Smokeless tobacco: Never Used  . Alcohol Use: No   OB History    Gravida Para Term Preterm AB TAB SAB Ectopic Multiple Living   Review of Systems ROS: Statement: All systems negative except as marked or noted in the HPI; Constitutional: Negative for fever and chills. ; ; Eyes: Negative for eye pain, redness and discharge. ; ; ENMT: Negative for ear pain, hoarseness, nasal congestion, sinus pressure and sore throat. ; ; Cardiovascular: +CP. Negative for palpitations, diaphoresis, dyspnea and peripheral edema. ; ; Respiratory: Negative for cough, wheezing and  stridor. ; ; Gastrointestinal: Negative for nausea, vomiting, diarrhea, abdominal pain, blood in stool, hematemesis, jaundice and rectal bleeding. . ; ; Genitourinary: +"cloudy urine." Negative for dysuria, flank pain and hematuria. ; ; Musculoskeletal: +back pain. Negative for neck pain. Negative for swelling and trauma.; ; Skin: Negative for pruritus, rash, abrasions, blisters, bruising and skin lesion.; ; Neuro: Negative for headache, lightheadedness and neck stiffness. Negative for weakness, altered level of consciousness , altered mental status, extremity weakness, paresthesias, involuntary movement, seizure and syncope.      Allergies  Metronidazole  Home Medications   Prior to Admission medications   Not on File   BP 126/88 mmHg  Pulse 66  Temp(Src) 98.2 F (36.8 C) (Oral)  Resp 10  Ht  (1.651 m)  Wt 162 lb (73.483 kg)  BMI 26.96 kg/m2  SpO2 97% Physical Exam  1040: Physical examination:  Nursing notes reviewed; Vital signs and O2 SAT reviewed;  Constitutional: Well developed, Well nourished, Well hydrated, In no acute distress; Head:  Normocephalic, atraumatic; Eyes: EOMI, PERRL, No scleral icterus; ENMT: Mouth and pharynx normal, Mucous membranes moist; Neck: Supple, Full range of motion, No lymphadenopathy; Cardiovascular: Regular rate and rhythm, No murmur, rub, or gallop; Respiratory: Breath sounds clear & equal bilaterally, No rales, rhonchi, wheezes.  Speaking full sentences with ease, Normal respiratory effort/excursion; Chest: +left upper chest wall tender to palp. No deformity, no rash, no soft tissue crepitus. Movement normal; Abdomen: Soft, Nontender, Nondistended, Normal bowel sounds; Genitourinary: No CVA tenderness; Spine:  No midline CS, TS, LS tenderness. +TTP left hypertonic thoracic paraspinal muscles. No rash.;; Extremities: Pulses normal, No tenderness, No edema, No calf edema or asymmetry.; Neuro: AA&Ox3, Major CN grossly intact.  Speech clear. No gross focal  motor or sensory deficits in extremities. Climbs on and off stretcher easily by herself. Gait steady.; Skin: Color normal, Warm, Dry.   ED Course  Procedures     EKG Interpretation   Date/Time:  Sunday February 10 2015 10:22:32 EDT Ventricular Rate:  73 PR Interval:  167 QRS Duration: 79 QT Interval:  381 QTC Calculation: 420 R Axis:   42 Text Interpretation:  Sinus rhythm Baseline wander Artifact No old tracing  to compare Confirmed by [facility]  MD, Nicholos Johns [phone]) on 02/10/2015  10:58:57 AM      MDM  MDM Reviewed: previous chart, nursing note and vitals Interpretation: labs, x-ray and ECG      Results for orders placed or performed during the hospital encounter of 02/10/15  D-dimer, quantitative (not at [facility])  Result Value Ref Range   D-Dimer, Quant <0.27 0.00 - 0.48 ug/mL-FEU  Urinalysis, Routine w reflex microscopic (not at [facility])  Result Value Ref Range   Color, Urine YELLOW YELLOW   APPearance CLEAR CLEAR   Specific Gravity, Urine 1.020 1.005 - 1.030   pH 6.0 5.0 - 8.0   Glucose, UA NEGATIVE NEGATIVE mg/dL   Hgb urine dipstick NEGATIVE NEGATIVE   Bilirubin Urine NEGATIVE NEGATIVE   Ketones, ur NEGATIVE NEGATIVE mg/dL   Protein, ur NEGATIVE NEGATIVE mg/dL   Urobilinogen, UA 0.2 0.0 - 1.0 mg/dL   Nitrite NEGATIVE NEGATIVE   Leukocytes, UA TRACE (A) NEGATIVE  Urine microscopic-add on  Result Value Ref Range   Squamous Epithelial / LPF FEW (A) RARE   WBC, UA 0-2 <3 WBC/hpf   Bacteria, UA MANY (A) RARE  I-stat Chem 8, ED  Result Value Ref Range   Sodium 141 135 - 145 mmol/L   Potassium 3.6 3.5 - 5.1 mmol/L   Chloride 102 101 - 111 mmol/L   BUN 12 6 - 20 mg/dL   Creatinine, Ser 6.04 0.44 - 1.00 mg/dL   Glucose, Bld 540 (H) 65 - 99 mg/dL   Calcium, Ion 9.81 (H) 1.12 - 1.23 mmol/L   TCO2 25 0 - 100 mmol/L   Hemoglobin 14.6 12.0 - 15.0 g/dL   HCT 19.1 47.8 - 29.5 %  I-stat troponin, ED  Result Value Ref Range   Troponin i, poc 0.00 0.00 - 0.08 ng/mL    Comment 3          I-stat troponin, ED  Result Value Ref Range   Troponin i, poc 0.00 0.00 - 0.08 ng/mL   Comment 3           Dg Chest 2 View 02/10/2015   CLINICAL DATA:  Left-sided upper chest pain and back pain with deep inspiration for 1 day.  EXAM: CHEST  2 VIEW  COMPARISON:  09/30/2007  FINDINGS: The cardiomediastinal silhouette is within normal limits. The lungs are well inflated and clear. There is no evidence of pleural effusion or pneumothorax. No acute osseous abnormality is identified.  IMPRESSION: No active cardiopulmonary disease.   Electronically Signed   By: Sebastian Ache   On: 02/10/2015 11:24  1445:  Pt has tol PO well while in the ED without N/V. Workup reassuring. No clear UTI on Udip; UC is pending and pt denies dysuria.  Doubt ACS as cause for symptoms with TIMI 0/Heart score 0. Doubt PE as cause for symptoms with low risk Well's and normal d-dimer. Pt states she is ready to go home now. Will tx symptomatically at this time. Dx and testing d/w pt and family.  Questions answered.  Verb understanding, agreeable to d/c home with outpt f/u.   Samuel Jester, DO 02/12/15 2306

## 2015-02-10 NOTE — ED Notes (Addendum)
Pt said she bent over this morning and thinks she might have pulled a muscle. Pt reports the pain starts in her left back and radiates around her left side up to her chest. Has been SOB since then.  Feels like she "can't take a deep breath". Pt also reports that her urine has been cloudy and foul smelling x 2 weeks, no pain, burning.

## 2015-02-10 NOTE — Discharge Instructions (Signed)
°Emergency Department Resource Guide °1) Find a Doctor and Pay Out of Pocket °Although you won't have to find out who is covered by your insurance plan, it is a good idea to ask around and get recommendations. You will then need to call the office and see if the doctor you have chosen will accept you as a new patient and what types of options they offer for patients who are self-pay. Some doctors offer discounts or will set up payment plans for their patients who do not have insurance, but you will need to ask so you aren't surprised when you get to your appointment. ° °2) Contact Your Local Health Department °Not all health departments have doctors that can see patients for sick visits, but many do, so it is worth a call to see if yours does. If you don't know where your local health department is, you can check in your phone book. The CDC also has a tool to help you locate your state's health department, and many state websites also have listings of all of their local health departments. ° °3) Find a Walk-in Clinic °If your illness is not likely to be very severe or complicated, you may want to try a walk in clinic. These are popping up all over the country in pharmacies, drugstores, and shopping centers. They're usually staffed by nurse practitioners or physician assistants that have been trained to treat common illnesses and complaints. They're usually fairly quick and inexpensive. However, if you have serious medical issues or chronic medical problems, these are probably not your best option. ° °No Primary Care Doctor: °- Call [facility] at  832-8000 - they can help you locate a primary care doctor that  accepts your insurance, provides certain services, etc. °- Physician Referral Service- 1-800-533-3463 ° °Chronic Pain Problems: °Organization         Address  Phone   Notes  °[facility]  ([phone]) [phone] Patients need to be referred by their primary care doctor.  ° °Medication  Assistance: °Organization         Address  Phone   Notes  °[place] [facility] [address] °[place], [place] [address] (336) 641-8030 --Must be a resident of [place] °-- Must have NO insurance coverage whatsoever (no Medicaid/ Medicare, etc.) °-- The pt. MUST have a primary care doctor that directs their care regularly and follows them in the community °  °[facility]  (866) 331-1348   °[employer]  (888) 892-1162   ° °Agencies that provide inexpensive medical care: °Organization         Address  Phone   Notes  °[facility]  (336) 832-8035   °[facility]    (336) 832-7272   °[facility] [address] °[place], [place] [address] (336) 832-4777   °[facility] [address], °[place] (336) 271-4999   °[facility]    (336) 373-0678   °[facility]    (336) 272-1050   °[facility] ° [address], [place] Phone:  (336) 832-4444, Fax:  (336) 832-4440 Hours of Operation:  9 am - 6 pm, M-F.  Also accepts Medicaid/Medicare and self-pay.  °[facility] ° [address],  Phone: (336) [phone], Fax: (336) [phone]. Hours of Operation:  8:30 am - 5:30 pm, M-F.  Also accepts Medicaid and self-pay.  °[facility] [place] [address], [place] Phone: (336) 878-6027   °[facility] [address], [place], [place] (336)723-1848, Ext. 123 Mondays & Thursdays: 7-9 AM.  First 15 patients are seen on a first come, first serve basis. °  ° °Medicaid-accepting [place] Providers: ° °Organization         Address  Phone   Notes  °[facility] [address], [place] (336) 641-2100 Also accepts self-pay patients.  °[facility] [address], [place] ° (336) 856-9996   °[facility] [address], [place]  (336) 288-8857   °[facility] [address] [place] [address], [place] (336) 299-7000   °Veita Bland [address], [place]  ° (336) 373-1557 Only accepts [place] Access Medicaid patients after they have their name applied to their card.  ° °Self-Pay (no insurance) in [place]: ° °Organization         Address  Phone   Notes  °Sickle Cell Patients, [facility] [address], [place] (336) 832-1970   °[facility] [address], [place] (336) 832-4400   °[facility] [place] ° [address], [place] (336) 992-4800   °[facility]/Dr. Osei-Bonsu ° [address] [place] [address], [place] or [address], [place] (336) [phone] Phone number for both [place] and [place] locations is the same.  °[facility] [address], [place] (336) 299-0000   °[facility] [address] [place] [address], [place] or [address] (336) 852-7530 °(336) 878-2260   °[facility] [address], [place] (336) 350-1642, phone; (336) 294-5005, fax Sees patients 1st and 3rd Saturday of every month.  Must not qualify for public or private insurance (i.e. Medicaid, Medicare, [place] Health Choice, Veterans' Benefits) • Household income should be no more than 200% of the poverty level •The clinic cannot treat you if you are pregnant or think you are pregnant • Sexually transmitted diseases are not treated at the clinic.  ° ° °Dental Care: °Organization         Address  Phone  Notes  °[place] [facility] [address], [place] (336) 641-6152 Accepts children up to age 21 who are enrolled in Medicaid or [place] Health Choice; pregnant women with a Medicaid card; and children who have applied for Medicaid or [place] Health Choice, but were declined, whose parents can pay a reduced fee at time of service.  °[place]  [facility] [place]  [address], [place] (336) 641-7733 Accepts children up to age 21 who are enrolled in Medicaid or [place] Health Choice; pregnant women with a Medicaid card; and children who have applied for Medicaid or  Health Choice, but were declined, whose parents can pay a reduced fee at time of service.  °[facility] ° [address],  (336) 641-4533 Patients are seen by appointment only. Walk-ins are not accepted. [facility] will see patients 18 years of age and older. °Monday - Tuesday (8am-5pm) °Most Wednesdays (8:30-5pm) °$30 per visit, cash only  °[facility] ° [address], [place] (336) 641-4533 Patients are seen by appointment only. Walk-ins are not accepted. [facility] will see patients 18 years of age and older. °One   Wednesday Evening (Monthly: Volunteer Based).  $30 per visit, cash only  °[facility]  (919) 537-3737 for adults; Children under age 4, call Graduate Pediatric Dentistry at ([phone]) [phone]. Children aged 4-14, please call (919) [phone] to request a pediatric application. ° Dental services are provided in all areas of dental care including fillings, crowns and bridges, complete and partial dentures, implants, gum treatment, root canals, and extractions. Preventive care is also provided. Treatment is provided to both adults and children. °Patients are selected via a lottery and there is often a waiting list. °  °[facility] [address], °[place] ° (336) 763-8833 [email or web address] °  °[facility] [address], [place], [place] (336)723-1848, Ext. 123 Second and Fourth Thursday of each month, opens at 6:30 AM; Clinic ends at 9 AM.  Patients are seen on a first-come first-served basis, and a limited number are seen during each clinic.  ° °[facility] ° [address], [place], [place] (336) 723-7904    Eligibility Requirements °You must have lived in [place], [place], or [place] counties for at least the last three months. °  You cannot be eligible for state or federal sponsored healthcare insurance, including Veterans Administration, Medicaid, or Medicare. °  You generally cannot be eligible for healthcare insurance through your employer.  °  How to apply: °Eligibility screenings are held every Tuesday and Wednesday afternoon from 1:00 pm until 4:00 pm. You do not need an appointment for the interview!  °[facility] [address], [place], [place] 336-631-2330   °[facility]  336-342-8273   °[facility]  336-703-3100   °[facility]  336-570-6415   ° °Behavioral Health Resources in the Community: °Intensive Outpatient Programs °Organization         Address  Phone  Notes  °[facility] [address], [place], [place] 336-878-6098   °[facility] [address], [place], [place] 336-832-9800   °[facility] [address], [place], [place] ° 336-882-2125   °[facility] [address],  °[place], [place] 1-800-853-5163 or 336-641-4981   °Substance Abuse Resources °Organization         Address  Phone  Notes  °[facility]  336-882-2125   °[facility]  336-784-9470   °[facility]  336-285-9073   °[facility]  336-845-3988   °Residential & Outpatient Substance Abuse Program  1-800-659-3381   °Psychological Services °Organization         Address  Phone  Notes  °[facility]  336- 832-9600   °[facility]  336- 378-7881   °[facility] [address], [place] 1-800-853-5163 or 336-641-4981   ° °Mobile Crisis Teams °Organization         Address  Phone  Notes  °[facility], Mobile Crisis Care Unit  1-877-626-1772   °Assertive °[facility] ° [address]  [place], [place] 336-834-9664   °Sharon DeEsch [address] ° [place] 336-554-5454   ° °Self-Help/Support Groups °Organization         Address  Phone             Notes  °[facility]  - variety of support groups  336- 373-1402 Call for more information  °Narcotics Anonymous (NA), [facility] [address], °[place]   2 meetings at this location  ° °  Residential Treatment Programs °Organization         Address  Phone  Notes  °[facility] [address],    °[place]  1-866-801-8205   °[facility] ° [address], [place], [place] 704-293-8524   °[facility] [address], [place] 336-845-3988 Admissions: 8am-3pm M-F  °[facility] [address]    °[place], [place] 336-841-1104   °[facility] [address], [place], [place] 336-379-7146   °[facility] [address]  °[place], [place] 336-285-9073   °[facility] Intensive Outpatient [address], [place], [place] 336-852-3033   °[facility] [address]  °[place], [place] 1-877-615-2722 or 336-784-9470   °[facility] [address] [place], [place] 336-227-7417 Accepts Medicaid  °[facility] [address]  °[place] 1-800-659-3381 Substance Abuse/Addiction Treatment  ° °[place] Behavioral Health Resources °Organization         Address  Phone  Notes  °[facility]  (888) 581-9988   °Julie Brannon, PhD [address] [place], [place]   (336) 349-5553 or (336) 951-0000   °[facility]   [address] °[place], [place] (336) 349-4454   °[facility] [address], [place], [place] (336) 342-8316 Insurance/Medicaid/sponsorship through [facility]  °[facility] [address]                                    [place], [place] (336) 342-8316 Therapy/tele-psych/case    °[facility] [address]  ° [place],  (336) 349-2233    °Dr. Arfeen  (336) 349-4544   °[facility] [place]  [employer] [place] [facility] 1) [address], [place] °2) [address], [place] °3)  [address], [place] (336) 349-3220 °(336) 342-7768 ° °(336) 342-8140   °[place] [facility] (336) 342-1394 or (336) 342-3537 (After Hours)    ° ° °Take the prescriptions as directed.  Apply moist heat or ice to the area(s) of discomfort, for 15 minutes at a time, several times per day for the next few days.  Do not fall asleep on a heating or ice pack.  Call your regular medical doctor on Monday to schedule a follow up appointment in the next 2 days.  Return to the Emergency Department immediately if worsening. ° °

## 2015-02-12 LAB — URINE CULTURE: Culture: >=100000

## 2015-02-13 NOTE — Progress Notes (Signed)
ED Antimicrobial Stewardship Positive Culture Follow Up   Danielle Rodriguez is an 49 y.o. female who presented to [facility] on 02/10/2015 with a chief complaint of  Chief Complaint  Patient presents with  . Shortness of Breath    Recent Results (from the past 720 hour(s))  Wet prep, genital     Status: None   Collection Time: 01/22/15 11:24 AM  Result Value Ref Range Status   Yeast Wet Prep HPF POC NONE SEEN NONE SEEN Final   Trich, Wet Prep NONE SEEN NONE SEEN Final   Clue Cells Wet Prep HPF POC NONE SEEN NONE SEEN Final   WBC, Wet Prep HPF POC FEW NONE SEEN Final  Urine culture     Status: None   Collection Time: 02/10/15 10:48 AM  Result Value Ref Range Status   Specimen Description URINE, CLEAN CATCH  Final   Special Requests NONE  Final   Culture   Final    >=100,000 COLONIES/mL ESCHERICHIA COLI Performed at [facility]    Report Status 02/12/2015 FINAL  Final   Organism ID, Bacteria ESCHERICHIA COLI  Final      Susceptibility   Escherichia coli - MIC*    AMPICILLIN 4 SENSITIVE Sensitive     CEFAZOLIN <=4 SENSITIVE Sensitive     CEFTRIAXONE <=1 SENSITIVE Sensitive     CIPROFLOXACIN <=0.25 SENSITIVE Sensitive     GENTAMICIN <=1 SENSITIVE Sensitive     IMIPENEM <=0.25 SENSITIVE Sensitive     NITROFURANTOIN <=16 SENSITIVE Sensitive     TRIMETH/SULFA <=20 SENSITIVE Sensitive     AMPICILLIN/SULBACTAM <=2 SENSITIVE Sensitive     PIP/TAZO <=4 SENSITIVE Sensitive     * >=100,000 COLONIES/mL ESCHERICHIA COLI   Will do a symptom check and if patient is still experiencing cloudy and foul smelling urine, will treat with Cephalexin 500 mg PO BID x 5 days.  If patient is not experiencing any symptoms, will not treat.  ED Provider: Roxy Horseman, PA   [name] 02/13/2015, 8:53 AM Infectious Diseases Pharmacist Phone# [phone]

## 2015-02-14 ENCOUNTER — Telehealth: Payer: Self-pay | Admitting: Emergency Medicine

## 2015-02-14 NOTE — Telephone Encounter (Signed)
Post ED Visit - Positive Culture Follow-up: Successful Patient Follow-Up  Culture assessed and recommendations reviewed by:  Celedonio Miyamoto, Pharm.D., BCPS-AQ ID  Georgina Pillion, Pharm.D., BCPS  [place], [address].D., BCPS, AAHIVP  Estella Husk, Pharm.D., BCPS, AAHIVP  Tegan Magsam, Pharm.D.  Isaac Bliss, Pharm.D.  Positive Urine culture   Patient discharged without antimicrobial prescription and treatment is now indicated  Organism is resistant to prescribed ED discharge antimicrobial  Patient with positive blood cultures  Changes discussed with ED provider: Ivar Drape PA New antibiotic prescription: if symptomatic, Cephalexin, 500 mg PO BID x five days  Will contact patient.   Jiles Harold 02/14/2015, 3:56 PM

## 2015-02-16 ENCOUNTER — Telehealth: Payer: Self-pay | Admitting: Emergency Medicine

## 2015-02-17 ENCOUNTER — Telehealth (HOSPITAL_COMMUNITY): Payer: Self-pay | Admitting: Emergency Medicine

## 2015-02-17 NOTE — Telephone Encounter (Signed)
Unable to contact by phone regarding lab results after multiple attempts. Letter sent.

## 2015-02-21 ENCOUNTER — Telehealth (HOSPITAL_COMMUNITY): Payer: Self-pay

## 2015-02-21 NOTE — Telephone Encounter (Signed)
Pt called after receiving letter. Informed of labs. Still having UTI symptoms. Per Ivar Drape Cephalexin  po bid x 5 days called to [facility] in [place] [phone], left on doctors voicemail.

## 2015-03-01 ENCOUNTER — Encounter: Payer: Self-pay | Admitting: *Deleted

## 2015-03-05 NOTE — Telephone Encounter (Signed)
error 

## 2016-03-17 ENCOUNTER — Ambulatory Visit (INDEPENDENT_AMBULATORY_CARE_PROVIDER_SITE_OTHER): Payer: 59 | Admitting: Obstetrics & Gynecology

## 2016-03-17 ENCOUNTER — Encounter: Payer: Self-pay | Admitting: Obstetrics & Gynecology

## 2016-03-17 VITALS — BP 146/82 | HR 67 | Ht 64.0 in | Wt 174.0 lb

## 2016-03-17 DIAGNOSIS — N760 Acute vaginitis: Secondary | ICD-10-CM | POA: Diagnosis not present

## 2016-03-17 DIAGNOSIS — Z01419 Encounter for gynecological examination (general) (routine) without abnormal findings: Secondary | ICD-10-CM | POA: Diagnosis not present

## 2016-03-17 DIAGNOSIS — A499 Bacterial infection, unspecified: Secondary | ICD-10-CM

## 2016-03-17 DIAGNOSIS — L298 Other pruritus: Secondary | ICD-10-CM | POA: Diagnosis not present

## 2016-03-17 DIAGNOSIS — B9689 Other specified bacterial agents as the cause of diseases classified elsewhere: Secondary | ICD-10-CM

## 2016-03-17 DIAGNOSIS — N898 Other specified noninflammatory disorders of vagina: Secondary | ICD-10-CM

## 2016-03-17 NOTE — Patient Instructions (Signed)
Thank you for enrolling in [facility]. Please follow the instructions below to securely access your online medical record. MyChart allows you to send messages to your doctor, view your test results, manage appointments, and more.   How Do I Sign Up? 1. In your Internet browser, go to [employer] and enter [email or web address]. 2. Click on the Sign Up Now link in the Sign In box. You will see the New Member Sign Up page. 3. Enter your MyChart Access Code exactly as it appears below. You will not need to use this code after you've completed the sign-up process. If you do not sign up before the expiration date, you must request a new code.  MyChart Access Code: [email or web address] Expires: 05/16/2016 10:52 AM  4. Enter your Social Security Number ([email or web address]) and Date of Birth (mm/dd/yyyy) as indicated and click Submit. You will be taken to the next sign-up page. 5. Create a MyChart ID. This will be your MyChart login ID and cannot be changed, so think of one that is secure and easy to remember. 6. Create a MyChart password. You can change your password at any time. 7. Enter your Password Reset Question and Answer. This can be used at a later time if you forget your password.  8. Enter your e-mail address. You will receive e-mail notification when new information is available in [facility]. 9. Click Sign Up. You can now view your medical record.   Additional Information Remember, MyChart is NOT to be used for urgent needs. For medical emergencies, dial 911.   Preventive Care for Adults, Female A healthy lifestyle and preventive care can promote health and wellness. Preventive health guidelines for women include the following key practices.  A routine yearly physical is a good way to check with your health care provider about your health and preventive screening. It is a chance to share any concerns and updates on your health and to receive a thorough exam.  Visit your dentist for a  routine exam and preventive care every 6 months. Brush your teeth twice a day and floss once a day. Good oral hygiene prevents tooth decay and gum disease.  The frequency of eye exams is based on your age, health, family medical history, use of contact lenses, and other factors. Follow your health care provider's recommendations for frequency of eye exams.  Eat a healthy diet. Foods like vegetables, fruits, whole grains, low-fat dairy products, and lean protein foods contain the nutrients you need without too many calories. Decrease your intake of foods high in solid fats, added sugars, and salt. Eat the right amount of calories for you.Get information about a proper diet from your health care provider, if necessary.  Regular physical exercise is one of the most important things you can do for your health. Most adults should get at least 150 minutes of moderate-intensity exercise (any activity that increases your heart rate and causes you to sweat) each week. In addition, most adults need muscle-strengthening exercises on 2 or more days a week.  Maintain a healthy weight. The body mass index (BMI) is a screening tool to identify possible weight problems. It provides an estimate of body fat based on height and weight. Your health care provider can find your BMI and can help you achieve or maintain a healthy weight.For adults 20 years and older:  A BMI below 18.5 is considered underweight.  A BMI of 18.5 to 24.9 is normal.  A BMI of 25 to 29.9 is considered overweight.  A BMI  of 30 and above is considered obese.  Maintain normal blood lipids and cholesterol levels by exercising and minimizing your intake of saturated fat. Eat a balanced diet with plenty of fruit and vegetables. Blood tests for lipids and cholesterol should begin at age 40 and be repeated every 5 years. If your lipid or cholesterol levels are high, you are over 50, or you are at high risk for heart disease, you may need your  cholesterol levels checked more frequently.Ongoing high lipid and cholesterol levels should be treated with medicines if diet and exercise are not working.  If you smoke, find out from your health care provider how to quit. If you do not use tobacco, do not start.  Lung cancer screening is recommended for adults aged 73-80 years who are at high risk for developing lung cancer because of a history of smoking. A yearly low-dose CT scan of the lungs is recommended for people who have at least a 30-pack-year history of smoking and are a current smoker or have quit within the past 15 years. A pack year of smoking is smoking an average of 1 pack of cigarettes a day for 1 year (for example: 1 pack a day for 30 years or 2 packs a day for 15 years). Yearly screening should continue until the smoker has stopped smoking for at least 15 years. Yearly screening should be stopped for people who develop a health problem that would prevent them from having lung cancer treatment.  If you are pregnant, do not drink alcohol. If you are breastfeeding, be very cautious about drinking alcohol. If you are not pregnant and choose to drink alcohol, do not have more than 1 drink per day. One drink is considered to be 12 ounces (355 mL) of beer, 5 ounces (148 mL) of wine, or 1.5 ounces (44 mL) of liquor.  Avoid use of street drugs. Do not share needles with anyone. Ask for help if you need support or instructions about stopping the use of drugs.  High blood pressure causes heart disease and increases the risk of stroke. Your blood pressure should be checked at least every 1 to 2 years. Ongoing high blood pressure should be treated with medicines if weight loss and exercise do not work.  If you are 47-56 years old, ask your health care provider if you should take aspirin to prevent strokes.  Diabetes screening is done by taking a blood sample to check your blood glucose level after you have not eaten for a certain period of time  (fasting). If you are not overweight and you do not have risk factors for diabetes, you should be screened once every 3 years starting at age 1. If you are overweight or obese and you are 19-2 years of age, you should be screened for diabetes every year as part of your cardiovascular risk assessment.  Breast cancer screening is essential preventive care for women. You should practice "breast self-awareness." This means understanding the normal appearance and feel of your breasts and may include breast self-examination. Any changes detected, no matter how small, should be reported to a health care provider. Women in their 38s and 30s should have a clinical breast exam (CBE) by a health care provider as part of a regular health exam every 1 to 3 years. After age 69, women should have a CBE every year. Starting at age 34, women should consider having a mammogram (breast X-ray test) every year. Women who have a family history of breast cancer  should talk to their health care provider about genetic screening. Women at a high risk of breast cancer should talk to their health care providers about having an MRI and a mammogram every year.  Breast cancer gene (BRCA)-related cancer risk assessment is recommended for women who have family members with BRCA-related cancers. BRCA-related cancers include breast, ovarian, tubal, and peritoneal cancers. Having family members with these cancers may be associated with an increased risk for harmful changes (mutations) in the breast cancer genes BRCA1 and BRCA2. Results of the assessment will determine the need for genetic counseling and BRCA1 and BRCA2 testing.  Your health care provider may recommend that you be screened regularly for cancer of the pelvic organs (ovaries, uterus, and vagina). This screening involves a pelvic examination, including checking for microscopic changes to the surface of your cervix (Pap test). You may be encouraged to have this screening done every  3 years, beginning at age 23.  For women ages 68-65, health care providers may recommend pelvic exams and Pap testing every 3 years, or they may recommend the Pap and pelvic exam, combined with testing for human papilloma virus (HPV), every 5 years. Some types of HPV increase your risk of cervical cancer. Testing for HPV may also be done on women of any age with unclear Pap test results.  Other health care providers may not recommend any screening for nonpregnant women who are considered low risk for pelvic cancer and who do not have symptoms. Ask your health care provider if a screening pelvic exam is right for you.  If you have had past treatment for cervical cancer or a condition that could lead to cancer, you need Pap tests and screening for cancer for at least 20 years after your treatment. If Pap tests have been discontinued, your risk factors (such as having a new sexual partner) need to be reassessed to determine if screening should resume. Some women have medical problems that increase the chance of getting cervical cancer. In these cases, your health care provider may recommend more frequent screening and Pap tests.  Colorectal cancer can be detected and often prevented. Most routine colorectal cancer screening begins at the age of 23 years and continues through age 7 years. However, your health care provider may recommend screening at an earlier age if you have risk factors for colon cancer. On a yearly basis, your health care provider may provide home test kits to check for hidden blood in the stool. Use of a small camera at the end of a tube, to directly examine the colon (sigmoidoscopy or colonoscopy), can detect the earliest forms of colorectal cancer. Talk to your health care provider about this at age 25, when routine screening begins. Direct exam of the colon should be repeated every 5-10 years through age 68 years, unless early forms of precancerous polyps or small growths are  found.  People who are at an increased risk for hepatitis B should be screened for this virus. You are considered at high risk for hepatitis B if:  You were born in a country where hepatitis B occurs often. Talk with your health care provider about which countries are considered high risk.  Your parents were born in a high-risk country and you have not received a shot to protect against hepatitis B (hepatitis B vaccine).  You have HIV or AIDS.  You use needles to inject street drugs.  You live with, or have sex with, someone who has hepatitis B.  You get hemodialysis treatment.  You take certain medicines for conditions like cancer, organ transplantation, and autoimmune conditions.  Hepatitis C blood testing is recommended for all people born from [birth year] through 1967 and any individual with known risks for hepatitis C.  Practice safe sex. Use condoms and avoid high-risk sexual practices to reduce the spread of sexually transmitted infections (STIs). STIs include gonorrhea, chlamydia, syphilis, trichomonas, herpes, HPV, and human immunodeficiency virus (HIV). Herpes, HIV, and HPV are viral illnesses that have no cure. They can result in disability, cancer, and death.  You should be screened for sexually transmitted illnesses (STIs) including gonorrhea and chlamydia if:  You are sexually active and are younger than 24 years.  You are older than 24 years and your health care provider tells you that you are at risk for this type of infection.  Your sexual activity has changed since you were last screened and you are at an increased risk for chlamydia or gonorrhea. Ask your health care provider if you are at risk.  If you are at risk of being infected with HIV, it is recommended that you take a prescription medicine daily to prevent HIV infection. This is called preexposure prophylaxis (PrEP). You are considered at risk if:  You are sexually active and do not regularly use condoms or know  the HIV status of your partner(s).  You take drugs by injection.  You are sexually active with a partner who has HIV.  Talk with your health care provider about whether you are at high risk of being infected with HIV. If you choose to begin PrEP, you should first be tested for HIV. You should then be tested every 3 months for as long as you are taking PrEP.  Osteoporosis is a disease in which the bones lose minerals and strength with aging. This can result in serious bone fractures or breaks. The risk of osteoporosis can be identified using a bone density scan. Women ages 32 years and over and women at risk for fractures or osteoporosis should discuss screening with their health care providers. Ask your health care provider whether you should take a calcium supplement or vitamin D to reduce the rate of osteoporosis.  Menopause can be associated with physical symptoms and risks. Hormone replacement therapy is available to decrease symptoms and risks. You should talk to your health care provider about whether hormone replacement therapy is right for you.  Use sunscreen. Apply sunscreen liberally and repeatedly throughout the day. You should seek shade when your shadow is shorter than you. Protect yourself by wearing long sleeves, pants, a wide-brimmed hat, and sunglasses year round, whenever you are outdoors.  Once a month, do a whole body skin exam, using a mirror to look at the skin on your back. Tell your health care provider of new moles, moles that have irregular borders, moles that are larger than a pencil eraser, or moles that have changed in shape or color.  Stay current with required vaccines (immunizations).  Influenza vaccine. All adults should be immunized every year.  Tetanus, diphtheria, and acellular pertussis (Td, Tdap) vaccine. Pregnant women should receive 1 dose of Tdap vaccine during each pregnancy. The dose should be obtained regardless of the length of time since the last  dose. Immunization is preferred during the 27th-36th week of gestation. An adult who has not previously received Tdap or who does not know her vaccine status should receive 1 dose of Tdap. This initial dose should be followed by tetanus and diphtheria toxoids (Td) booster doses every  10 years. Adults with an unknown or incomplete history of completing a 3-dose immunization series with Td-containing vaccines should begin or complete a primary immunization series including a Tdap dose. Adults should receive a Td booster every 10 years.  Varicella vaccine. An adult without evidence of immunity to varicella should receive 2 doses or a second dose if she has previously received 1 dose. Pregnant females who do not have evidence of immunity should receive the first dose after pregnancy. This first dose should be obtained before leaving the health care facility. The second dose should be obtained 4-8 weeks after the first dose.  Human papillomavirus (HPV) vaccine. Females aged 13-26 years who have not received the vaccine previously should obtain the 3-dose series. The vaccine is not recommended for use in pregnant females. However, pregnancy testing is not needed before receiving a dose. If a female is found to be pregnant after receiving a dose, no treatment is needed. In that case, the remaining doses should be delayed until after the pregnancy. Immunization is recommended for any person with an immunocompromised condition through the age of 57 years if she did not get any or all doses earlier. During the 3-dose series, the second dose should be obtained 4-8 weeks after the first dose. The third dose should be obtained 24 weeks after the first dose and 16 weeks after the second dose.  Zoster vaccine. One dose is recommended for adults aged 39 years or older unless certain conditions are present.  Measles, mumps, and rubella (MMR) vaccine. Adults born before [birth year] generally are considered immune to measles and  mumps. Adults born in [birth year] or later should have 1 or more doses of MMR vaccine unless there is a contraindication to the vaccine or there is laboratory evidence of immunity to each of the three diseases. A routine second dose of MMR vaccine should be obtained at least 28 days after the first dose for students attending postsecondary schools, health care workers, or international travelers. People who received inactivated measles vaccine or an unknown type of measles vaccine during 1963-1967 should receive 2 doses of MMR vaccine. People who received inactivated mumps vaccine or an unknown type of mumps vaccine before 1979 and are at high risk for mumps infection should consider immunization with 2 doses of MMR vaccine. For females of childbearing age, rubella immunity should be determined. If there is no evidence of immunity, females who are not pregnant should be vaccinated. If there is no evidence of immunity, females who are pregnant should delay immunization until after pregnancy. Unvaccinated health care workers born before [birth year] who lack laboratory evidence of measles, mumps, or rubella immunity or laboratory confirmation of disease should consider measles and mumps immunization with 2 doses of MMR vaccine or rubella immunization with 1 dose of MMR vaccine.  Pneumococcal 13-valent conjugate (PCV13) vaccine. When indicated, a person who is uncertain of his immunization history and has no record of immunization should receive the PCV13 vaccine. All adults 66 years of age and older should receive this vaccine. An adult aged 42 years or older who has certain medical conditions and has not been previously immunized should receive 1 dose of PCV13 vaccine. This PCV13 should be followed with a dose of pneumococcal polysaccharide (PPSV23) vaccine. Adults who are at high risk for pneumococcal disease should obtain the PPSV23 vaccine at least 8 weeks after the dose of PCV13 vaccine. Adults older than 52 years of age who  have normal immune system function should obtain the PPSV23 vaccine  dose at least 1 year after the dose of PCV13 vaccine.  Pneumococcal polysaccharide (PPSV23) vaccine. When PCV13 is also indicated, PCV13 should be obtained first. All adults aged 3 years and older should be immunized. An adult younger than age 14 years who has certain medical conditions should be immunized. Any person who resides in a nursing home or long-term care facility should be immunized. An adult smoker should be immunized. People with an immunocompromised condition and certain other conditions should receive both PCV13 and PPSV23 vaccines. People with human immunodeficiency virus (HIV) infection should be immunized as soon as possible after diagnosis. Immunization during chemotherapy or radiation therapy should be avoided. Routine use of PPSV23 vaccine is not recommended for American Indians, [place] Natives, or people younger than 65 years unless there are medical conditions that require PPSV23 vaccine. When indicated, people who have unknown immunization and have no record of immunization should receive PPSV23 vaccine. One-time revaccination 5 years after the first dose of PPSV23 is recommended for people aged 19-64 years who have chronic kidney failure, nephrotic syndrome, asplenia, or immunocompromised conditions. People who received 1-2 doses of PPSV23 before age 3 years should receive another dose of PPSV23 vaccine at age 30 years or later if at least 5 years have passed since the previous dose. Doses of PPSV23 are not needed for people immunized with PPSV23 at or after age 58 years.  Meningococcal vaccine. Adults with asplenia or persistent complement component deficiencies should receive 2 doses of quadrivalent meningococcal conjugate (MenACWY-D) vaccine. The doses should be obtained at least 2 months apart. Microbiologists working with certain meningococcal bacteria, [employer] recruits, people at risk during an outbreak, and  people who travel to or live in countries with a high rate of meningitis should be immunized. A first-year college student up through age 71 years who is living in a residence hall should receive a dose if she did not receive a dose on or after her 16th birthday. Adults who have certain high-risk conditions should receive one or more doses of vaccine.  Hepatitis A vaccine. Adults who wish to be protected from this disease, have certain high-risk conditions, work with hepatitis A-infected animals, work in hepatitis A research labs, or travel to or work in countries with a high rate of hepatitis A should be immunized. Adults who were previously unvaccinated and who anticipate close contact with an international adoptee during the first 60 days after arrival in the [place] from a country with a high rate of hepatitis A should be immunized.  Hepatitis B vaccine. Adults who wish to be protected from this disease, have certain high-risk conditions, may be exposed to blood or other infectious body fluids, are household contacts or sex partners of hepatitis B positive people, are clients or workers in certain care facilities, or travel to or work in countries with a high rate of hepatitis B should be immunized.  Haemophilus influenzae type b (Hib) vaccine. A previously unvaccinated person with asplenia or sickle cell disease or having a scheduled splenectomy should receive 1 dose of Hib vaccine. Regardless of previous immunization, a recipient of a hematopoietic stem cell transplant should receive a 3-dose series 6-12 months after her successful transplant. Hib vaccine is not recommended for adults with HIV infection. Preventive Services / Frequency Ages 24 to 85 years  Blood pressure check.** / Every 3-5 years.  Lipid and cholesterol check.** / Every 5 years beginning at age 17.  Clinical breast exam.** / Every 3 years for women in their 29s  and 30s.  BRCA-related cancer risk assessment.** / For women  who have family members with a BRCA-related cancer (breast, ovarian, tubal, or peritoneal cancers).  Pap test.** / Every 2 years from ages 49 through 79. Every 3 years starting at age 52 through age 40 or 20 with a history of 3 consecutive normal Pap tests.  HPV screening.** / Every 3 years from ages 57 through ages 69 to 72 with a history of 3 consecutive normal Pap tests.  Hepatitis C blood test.** / For any individual with known risks for hepatitis C.  Skin self-exam. / Monthly.  Influenza vaccine. / Every year.  Tetanus, diphtheria, and acellular pertussis (Tdap, Td) vaccine.** / Consult your health care provider. Pregnant women should receive 1 dose of Tdap vaccine during each pregnancy. 1 dose of Td every 10 years.  Varicella vaccine.** / Consult your health care provider. Pregnant females who do not have evidence of immunity should receive the first dose after pregnancy.  HPV vaccine. / 3 doses over 6 months, if 44 and younger. The vaccine is not recommended for use in pregnant females. However, pregnancy testing is not needed before receiving a dose.  Measles, mumps, rubella (MMR) vaccine.** / You need at least 1 dose of MMR if you were born in 1957 or later. You may also need a 2nd dose. For females of childbearing age, rubella immunity should be determined. If there is no evidence of immunity, females who are not pregnant should be vaccinated. If there is no evidence of immunity, females who are pregnant should delay immunization until after pregnancy.  Pneumococcal 13-valent conjugate (PCV13) vaccine.** / Consult your health care provider.  Pneumococcal polysaccharide (PPSV23) vaccine.** / 1 to 2 doses if you smoke cigarettes or if you have certain conditions.  Meningococcal vaccine.** / 1 dose if you are age 34 to 49 years and a Market researcher living in a residence hall, or have one of several medical conditions, you need to get vaccinated against meningococcal  disease. You may also need additional booster doses.  Hepatitis A vaccine.** / Consult your health care provider.  Hepatitis B vaccine.** / Consult your health care provider.  Haemophilus influenzae type b (Hib) vaccine.** / Consult your health care provider. Ages 16 to 35 years  Blood pressure check.** / Every year.  Lipid and cholesterol check.** / Every 5 years beginning at age 31 years.  Lung cancer screening. / Every year if you are aged 54-80 years and have a 30-pack-year history of smoking and currently smoke or have quit within the past 15 years. Yearly screening is stopped once you have quit smoking for at least 15 years or develop a health problem that would prevent you from having lung cancer treatment.  Clinical breast exam.** / Every year after age 13 years.  BRCA-related cancer risk assessment.** / For women who have family members with a BRCA-related cancer (breast, ovarian, tubal, or peritoneal cancers).  Mammogram.** / Every year beginning at age 35 years and continuing for as long as you are in good health. Consult with your health care provider.  Pap test.** / Every 3 years starting at age 24 years through age 65 or 66 years with a history of 3 consecutive normal Pap tests.  HPV screening.** / Every 3 years from ages 87 years through ages 64 to 75 years with a history of 3 consecutive normal Pap tests.  Fecal occult blood test (FOBT) of stool. / Every year beginning at age 2 years and continuing  until age 40 years. You may not need to do this test if you get a colonoscopy every 10 years.  Flexible sigmoidoscopy or colonoscopy.** / Every 5 years for a flexible sigmoidoscopy or every 10 years for a colonoscopy beginning at age 75 years and continuing until age 52 years.  Hepatitis C blood test.** / For all people born from [birth year] through 1965 and any individual with known risks for hepatitis C.  Skin self-exam. / Monthly.  Influenza vaccine. / Every year.  Tetanus,  diphtheria, and acellular pertussis (Tdap/Td) vaccine.** / Consult your health care provider. Pregnant women should receive 1 dose of Tdap vaccine during each pregnancy. 1 dose of Td every 10 years.  Varicella vaccine.** / Consult your health care provider. Pregnant females who do not have evidence of immunity should receive the first dose after pregnancy.  Zoster vaccine.** / 1 dose for adults aged 61 years or older.  Measles, mumps, rubella (MMR) vaccine.** / You need at least 1 dose of MMR if you were born in 1957 or later. You may also need a second dose. For females of childbearing age, rubella immunity should be determined. If there is no evidence of immunity, females who are not pregnant should be vaccinated. If there is no evidence of immunity, females who are pregnant should delay immunization until after pregnancy.  Pneumococcal 13-valent conjugate (PCV13) vaccine.** / Consult your health care provider.  Pneumococcal polysaccharide (PPSV23) vaccine.** / 1 to 2 doses if you smoke cigarettes or if you have certain conditions.  Meningococcal vaccine.** / Consult your health care provider.  Hepatitis A vaccine.** / Consult your health care provider.  Hepatitis B vaccine.** / Consult your health care provider.  Haemophilus influenzae type b (Hib) vaccine.** / Consult your health care provider. Ages 41 years and over  Blood pressure check.** / Every year.  Lipid and cholesterol check.** / Every 5 years beginning at age 45 years.  Lung cancer screening. / Every year if you are aged 50-80 years and have a 30-pack-year history of smoking and currently smoke or have quit within the past 15 years. Yearly screening is stopped once you have quit smoking for at least 15 years or develop a health problem that would prevent you from having lung cancer treatment.  Clinical breast exam.** / Every year after age 39 years.  BRCA-related cancer risk assessment.** / For women who have family  members with a BRCA-related cancer (breast, ovarian, tubal, or peritoneal cancers).  Mammogram.** / Every year beginning at age 70 years and continuing for as long as you are in good health. Consult with your health care provider.  Pap test.** / Every 3 years starting at age 43 years through age 5 or 81 years with 3 consecutive normal Pap tests. Testing can be stopped between 65 and 70 years with 3 consecutive normal Pap tests and no abnormal Pap or HPV tests in the past 10 years.  HPV screening.** / Every 3 years from ages 82 years through ages 96 or 41 years with a history of 3 consecutive normal Pap tests. Testing can be stopped between 65 and 70 years with 3 consecutive normal Pap tests and no abnormal Pap or HPV tests in the past 10 years.  Fecal occult blood test (FOBT) of stool. / Every year beginning at age 47 years and continuing until age 15 years. You may not need to do this test if you get a colonoscopy every 10 years.  Flexible sigmoidoscopy or colonoscopy.** / Every 5 years  for a flexible sigmoidoscopy or every 10 years for a colonoscopy beginning at age 108 years and continuing until age 12 years.  Hepatitis C blood test.** / For all people born from [birth year] through 1965 and any individual with known risks for hepatitis C.  Osteoporosis screening.** / A one-time screening for women ages 85 years and over and women at risk for fractures or osteoporosis.  Skin self-exam. / Monthly.  Influenza vaccine. / Every year.  Tetanus, diphtheria, and acellular pertussis (Tdap/Td) vaccine.** / 1 dose of Td every 10 years.  Varicella vaccine.** / Consult your health care provider.  Zoster vaccine.** / 1 dose for adults aged 76 years or older.  Pneumococcal 13-valent conjugate (PCV13) vaccine.** / Consult your health care provider.  Pneumococcal polysaccharide (PPSV23) vaccine.** / 1 dose for all adults aged 30 years and older.  Meningococcal vaccine.** / Consult your health care  provider.  Hepatitis A vaccine.** / Consult your health care provider.  Hepatitis B vaccine.** / Consult your health care provider.  Haemophilus influenzae type b (Hib) vaccine.** / Consult your health care provider. ** Family history and personal history of risk and conditions may change your health care provider's recommendations.   This information is not intended to replace advice given to you by your health care provider. Make sure you discuss any questions you have with your health care provider.   Document Released: 09/01/2001 Document Revised: 07/27/2014 Document Reviewed: 12/01/2010 Elsevier Interactive Patient Education [employer].

## 2016-03-17 NOTE — Progress Notes (Signed)
GYNECOLOGY ANNUAL PREVENTATIVE CARE ENCOUNTER NOTE  Subjective:   Danielle Rodriguez is a 50 y.o. G75P2002 female here for a routine annual gynecologic exam.  Current complaints: vaginal itching and discharge for 2 weeks.  Denies abnormal vaginal bleeding, discharge, pelvic pain, problems with intercourse or other gynecologic concerns.    Gynecologic History No LMP recorded. Patient has had a hysterectomy for benign indications. Last mammogram: 03/17/16. Results were: pending at the time of this encounter  Obstetric History OB History  Gravida Para Term Preterm AB Living  2 2 2  0 0 2  SAB TAB Ectopic Multiple Live Births  0 0 0 0 2    # Outcome Date GA Lbr Len/2nd Weight Sex Delivery Anes PTL Lv  2 Term 38    M Vag-Spont   LIV  1 Term 69    M Vag-Spont   LIV      Past Medical History:  Diagnosis Date  . ZOXWRUEA(540.9)     Past Surgical History:  Procedure Laterality Date  . HEMORROIDECTOMY    . VAGINAL HYSTERECTOMY  2003    Current Outpatient Prescriptions on File Prior to Visit  Medication Sig Dispense Refill  . methocarbamol (ROBAXIN) 500 MG tablet Take 2 tablets (1,000 mg total) by mouth 4 (four) times daily as needed for muscle spasms (muscle spasm/pain). (Patient not taking: Reported on 03/17/2016) 25 tablet 0  . naproxen (NAPROSYN) 250 MG tablet Take 1 tablet (250 mg total) by mouth 2 (two) times daily as needed for mild pain or moderate pain (take with food). (Patient not taking: Reported on 03/17/2016) 14 tablet 0  . oxyCODONE-acetaminophen (PERCOCET/ROXICET) 5-325 MG per tablet 1 or 2 tabs PO q6h prn pain (Patient not taking: Reported on 03/17/2016) 20 tablet 0   No current facility-administered medications on file prior to visit.     Allergies  Allergen Reactions  . Metronidazole Swelling    Social History   Social History  . Marital status: Married    Spouse name: N/A  . Number of children: N/A  . Years of education: N/A   Occupational History  .  Not on file.   Social History Main Topics  . Smoking status: Never Smoker  . Smokeless tobacco: Never Used  . Alcohol use No  . Drug use: No  . Sexual activity: Yes    Partners: Male    Birth control/ protection: Surgical     Comment: hysterectomy   Other Topics Concern  . Not on file   Social History Narrative  . No narrative on file    Family History  Problem Relation Age of Onset  . Diabetes Mother   . Hypertension Mother   . Cancer Brother 50    LUNG CANCER  . Cancer Maternal Aunt   . Cancer Paternal Grandmother     BREAST  . Cancer Maternal Uncle   . Cancer Cousin     The following portions of the patient's history were reviewed and updated as appropriate: allergies, current medications, past family history, past medical history, past social history, past surgical history and problem list.  Review of Systems Pertinent items noted in HPI and remainder of comprehensive ROS otherwise negative.   Objective:  BP (!) 146/82 (BP Location: Left Arm, Patient Position: Sitting)   Pulse 67   Ht 5\' 4"  (1.626 m)   Wt 174 lb (78.9 kg)   BMI 29.87 kg/m  CONSTITUTIONAL: Well-developed, well-nourished female in no acute distress.  HENT:  Normocephalic, atraumatic, External  right and left ear normal. Oropharynx is clear and moist EYES: Conjunctivae and EOM are normal. Pupils are equal, round, and reactive to light. No scleral icterus.  NECK: Normal range of motion, supple, no masses.  Normal thyroid.  SKIN: Skin is warm and dry. No rash noted. Not diaphoretic. No erythema. No pallor. NEUROLOGIC: Alert and oriented to person, place, and time. Normal reflexes, muscle tone coordination. No cranial nerve deficit noted. PSYCHIATRIC: Normal mood and affect. Normal behavior. Normal judgment and thought content. CARDIOVASCULAR: Normal heart rate noted, regular rhythm RESPIRATORY: Clear to auscultation bilaterally. Effort and breath sounds normal, no problems with respiration  noted. BREASTS: Symmetric in size. No masses, skin changes, nipple drainage, or lymphadenopathy. ABDOMEN: Soft, normal bowel sounds, no distention noted.  No tenderness, rebound or guarding.  PELVIC: Normal appearing external genitalia; normal appearing vaginal mucosa and cuff.  White discharge noted; sample obtained for testing. No palpable masses on bimanual exam. MUSCULOSKELETAL: Normal range of motion. No tenderness.  No cyanosis, clubbing, or edema.  2+ distal pulses.   Assessment:  Annual gynecologic examination  Vulvovaginitis   Plan:  Will follow up results of wet prep and manage accordingly. Will follow up mammogram results. Routine preventative health maintenance measures emphasized. Please refer to After Visit Summary for other counseling recommendations.    Jaynie CollinsUGONNA  Mishti Swanton, MD, FACOG Attending Obstetrician & Gynecologist, [facility]

## 2016-03-18 ENCOUNTER — Telehealth: Payer: Self-pay | Admitting: *Deleted

## 2016-03-18 LAB — WET PREP BY MOLECULAR PROBE
CANDIDA SPECIES: NEGATIVE
GARDNERELLA VAGINALIS: POSITIVE — AB
Trichomonas vaginosis: NEGATIVE

## 2016-03-18 MED ORDER — CLINDAMYCIN HCL 300 MG PO CAPS: 300.0000 mg | ORAL_CAPSULE | Freq: Two times a day (BID) | ORAL | 0 refills | Status: DC

## 2016-03-18 NOTE — Telephone Encounter (Signed)
-----   Message from Tereso NewcomerUgonna A Anyanwu, MD sent at 03/18/2016  2:38 PM EDT ----- Wet prep is abnormal and showed bacterial vaginitis. Clindamycin was prescribed as she is allergic to Metronidazole.   Please inform patient of results and advise to pick up prescription.

## 2016-03-18 NOTE — Telephone Encounter (Signed)
Called pt to go over lab results. VM not set up. Unable to leave a message

## 2016-03-18 NOTE — Addendum Note (Signed)
Addended by: Jaynie CollinsANYANWU, Lacresha Fusilier A on: 03/18/2016 02:38 PM   Modules accepted: Orders

## 2016-11-05 ENCOUNTER — Other Ambulatory Visit: Payer: Self-pay | Admitting: Family Medicine

## 2016-11-05 DIAGNOSIS — R300 Dysuria: Secondary | ICD-10-CM

## 2017-12-07 ENCOUNTER — Other Ambulatory Visit: Payer: Self-pay

## 2017-12-07 ENCOUNTER — Emergency Department (HOSPITAL_COMMUNITY)
Admission: EM | Admit: 2017-12-07 | Discharge: 2017-12-07 | Disposition: A | Discharge status: Home / Self Care | Payer: 59 | Attending: Emergency Medicine | Admitting: Emergency Medicine

## 2017-12-07 ENCOUNTER — Emergency Department (HOSPITAL_COMMUNITY): Payer: 59

## 2017-12-07 ENCOUNTER — Encounter (HOSPITAL_COMMUNITY): Payer: Self-pay | Admitting: Emergency Medicine

## 2017-12-07 DIAGNOSIS — Z23 Encounter for immunization: Secondary | ICD-10-CM | POA: Diagnosis not present

## 2017-12-07 DIAGNOSIS — M79604 Pain in right leg: Secondary | ICD-10-CM | POA: Insufficient documentation

## 2017-12-07 DIAGNOSIS — M25511 Pain in right shoulder: Secondary | ICD-10-CM | POA: Insufficient documentation

## 2017-12-07 DIAGNOSIS — T07XXXA Unspecified multiple injuries, initial encounter: Secondary | ICD-10-CM

## 2017-12-07 MED ORDER — TETANUS-DIPHTH-ACELL PERTUSSIS 5-2.5-18.5 LF-MCG/0.5 IM SUSP
0.5000 mL | Freq: Once | INTRAMUSCULAR | Status: AC
  Administered 2017-12-07: 0.5 mL via INTRAMUSCULAR
  Filled 2017-12-07: qty 0.5

## 2017-12-07 MED ORDER — CYCLOBENZAPRINE HCL 10 MG PO TABS
10.0000 mg | ORAL_TABLET | Freq: Once | ORAL | Status: DC
  Filled 2017-12-07: qty 1

## 2017-12-07 MED ORDER — HYDROCODONE-ACETAMINOPHEN 5-325 MG PO TABS: 1.0000 | ORAL_TABLET | ORAL | 0 refills | Status: DC | PRN

## 2017-12-07 MED ORDER — CYCLOBENZAPRINE HCL 10 MG PO TABS: 10.0000 mg | ORAL_TABLET | Freq: Three times a day (TID) | ORAL | 0 refills | Status: DC

## 2017-12-07 MED ORDER — HYDROCODONE-ACETAMINOPHEN 5-325 MG PO TABS
1.0000 | ORAL_TABLET | Freq: Once | ORAL | Status: DC
Start: 2017-12-07 — End: 2017-12-07
  Filled 2017-12-07: qty 1

## 2017-12-07 MED ORDER — ONDANSETRON HCL 4 MG PO TABS
4.0000 mg | ORAL_TABLET | Freq: Once | ORAL | Status: DC
  Filled 2017-12-07: qty 1
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
## 2017-12-07 NOTE — Discharge Instructions (Addendum)
Your vital signs are nonacute.  The x-ray of your shoulder is negative for fracture or dislocation.  You have abrasions of your shoulder and of your leg, and you have a contusion of your face on the examination.  You can expect to be sore in various areas over the next couple of days.  Please use Tylenol or ibuprofen for mild pain.  Use Flexeril 3 times daily for spasm pain, use Norco for severe pain.  Both of these medications may cause drowsiness.  Please use them with caution.

## 2018-04-30 ENCOUNTER — Emergency Department (HOSPITAL_COMMUNITY): Payer: Self-pay

## 2018-04-30 ENCOUNTER — Other Ambulatory Visit: Payer: Self-pay

## 2018-04-30 ENCOUNTER — Encounter (HOSPITAL_COMMUNITY): Payer: Self-pay | Admitting: Emergency Medicine

## 2018-04-30 ENCOUNTER — Emergency Department (HOSPITAL_COMMUNITY)
Admission: EM | Admit: 2018-04-30 | Discharge: 2018-04-30 | Disposition: A | Discharge status: Home / Self Care | Payer: Self-pay | Attending: Emergency Medicine | Admitting: Emergency Medicine

## 2018-04-30 DIAGNOSIS — T1490XA Injury, unspecified, initial encounter: Secondary | ICD-10-CM

## 2018-04-30 DIAGNOSIS — R52 Pain, unspecified: Secondary | ICD-10-CM

## 2018-04-30 DIAGNOSIS — Z79899 Other long term (current) drug therapy: Secondary | ICD-10-CM | POA: Insufficient documentation

## 2018-04-30 DIAGNOSIS — Z041 Encounter for examination and observation following transport accident: Secondary | ICD-10-CM | POA: Insufficient documentation

## 2018-04-30 LAB — SAMPLE TO BLOOD BANK

## 2018-04-30 LAB — I-STAT CG4 LACTIC ACID, ED: Lactic Acid, Venous: 1.79 mmol/L (ref 0.5–1.9)

## 2018-04-30 LAB — URINALYSIS, ROUTINE W REFLEX MICROSCOPIC
Bilirubin Urine: NEGATIVE
GLUCOSE, UA: NEGATIVE mg/dL
Ketones, ur: NEGATIVE mg/dL
Nitrite: POSITIVE — AB
PH: 8 (ref 5.0–8.0)
Protein, ur: NEGATIVE mg/dL
SPECIFIC GRAVITY, URINE: 1.011 (ref 1.005–1.030)

## 2018-04-30 LAB — CBC
HEMATOCRIT: 43.8 % (ref 36.0–46.0)
Hemoglobin: 13.9 g/dL (ref 12.0–15.0)
MCH: 27.7 pg (ref 26.0–34.0)
MCHC: 31.7 g/dL (ref 30.0–36.0)
MCV: 87.4 fL (ref 80.0–100.0)
NRBC: 0 % (ref 0.0–0.2)
PLATELETS: 235 10*3/uL (ref 150–400)
RBC: 5.01 MIL/uL (ref 3.87–5.11)
RDW: 12.1 % (ref 11.5–15.5)
WBC: 12.9 10*3/uL — AB (ref 4.0–10.5)

## 2018-04-30 LAB — I-STAT CHEM 8, ED
BUN: 15 mg/dL (ref 6–20)
CHLORIDE: 104 mmol/L (ref 98–111)
CREATININE: 1.1 mg/dL — AB (ref 0.44–1.00)
Calcium, Ion: 1.14 mmol/L — ABNORMAL LOW (ref 1.15–1.40)
GLUCOSE: 136 mg/dL — AB (ref 70–99)
HEMATOCRIT: 42 % (ref 36.0–46.0)
HEMOGLOBIN: 14.3 g/dL (ref 12.0–15.0)
POTASSIUM: 3.9 mmol/L (ref 3.5–5.1)
Sodium: 139 mmol/L (ref 135–145)
TCO2: 31 mmol/L (ref 22–32)

## 2018-04-30 LAB — COMPREHENSIVE METABOLIC PANEL
ALK PHOS: 74 U/L (ref 38–126)
ALT: 18 U/L (ref 0–44)
AST: 26 U/L (ref 15–41)
Albumin: 4.2 g/dL (ref 3.5–5.0)
Anion gap: 10 (ref 5–15)
BUN: 12 mg/dL (ref 6–20)
CALCIUM: 9.7 mg/dL (ref 8.9–10.3)
CHLORIDE: 103 mmol/L (ref 98–111)
CO2: 25 mmol/L (ref 22–32)
CREATININE: 1.09 mg/dL — AB (ref 0.44–1.00)
GFR, EST NON AFRICAN AMERICAN: 56 mL/min — AB (ref >60)
Glucose, Bld: 136 mg/dL — ABNORMAL HIGH (ref 70–99)
Potassium: 3.7 mmol/L (ref 3.5–5.1)
Sodium: 138 mmol/L (ref 135–145)
Total Bilirubin: 0.8 mg/dL (ref 0.3–1.2)
Total Protein: 7.5 g/dL (ref 6.5–8.1)

## 2018-04-30 LAB — PROTIME-INR
INR: 0.96
PROTHROMBIN TIME: 12.7 s (ref 11.4–15.2)

## 2018-04-30 LAB — ETHANOL: Alcohol, Ethyl (B): <10 mg/dL (ref <10)

## 2018-04-30 LAB — I-STAT BETA HCG BLOOD, ED (MC, WL, AP ONLY)

## 2018-04-30 LAB — CDS SEROLOGY

## 2018-04-30 MED ORDER — SODIUM CHLORIDE 0.9 % IV BOLUS
1000.0000 mL | Freq: Once | INTRAVENOUS | Status: AC
  Administered 2018-04-30: 1000 mL via INTRAVENOUS

## 2018-04-30 MED ORDER — MORPHINE SULFATE (PF) 4 MG/ML IV SOLN
4.0000 mg | Freq: Once | INTRAVENOUS | Status: DC
  Filled 2018-04-30: qty 1
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
## 2018-04-30 NOTE — ED Notes (Signed)
Assumed care of patient, pt in radiology at this time.  
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
## 2018-05-01 NOTE — ED Notes (Signed)
Pt refused Morphine for pain- spoke with pharmacist Alinda Money- Returned unopened Morphine to pharmacy.

## 2019-02-12 ENCOUNTER — Other Ambulatory Visit: Payer: Self-pay

## 2019-02-12 ENCOUNTER — Emergency Department (HOSPITAL_COMMUNITY)
Admission: EM | Admit: 2019-02-12 | Discharge: 2019-02-12 | Discharge status: Against Medical Advice | Payer: BC Managed Care – PPO | Attending: Emergency Medicine | Admitting: Emergency Medicine

## 2019-02-12 ENCOUNTER — Encounter (HOSPITAL_COMMUNITY): Payer: Self-pay | Admitting: Emergency Medicine

## 2019-02-12 DIAGNOSIS — Z5321 Procedure and treatment not carried out due to patient leaving prior to being seen by health care provider: Secondary | ICD-10-CM | POA: Insufficient documentation

## 2019-02-12 DIAGNOSIS — R21 Rash and other nonspecific skin eruption: Secondary | ICD-10-CM | POA: Diagnosis present

## 2019-02-12 NOTE — ED Triage Notes (Signed)
Pt c/o rash to right foot x over 1 yr, pt reports she saw her PCP last year and was given a topical cream to apply which did not help, pt denies pain
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
## 2020-10-02 ENCOUNTER — Other Ambulatory Visit: Payer: Self-pay

## 2020-10-02 ENCOUNTER — Emergency Department (HOSPITAL_COMMUNITY)
Admission: EM | Admit: 2020-10-02 | Discharge: 2020-10-02 | Disposition: A | Discharge status: Home / Self Care | Payer: BLUE CROSS/BLUE SHIELD | Attending: Emergency Medicine | Admitting: Emergency Medicine

## 2020-10-02 ENCOUNTER — Encounter (HOSPITAL_COMMUNITY): Payer: Self-pay | Admitting: Emergency Medicine

## 2020-10-02 DIAGNOSIS — I10 Essential (primary) hypertension: Secondary | ICD-10-CM | POA: Diagnosis not present

## 2020-10-02 DIAGNOSIS — R202 Paresthesia of skin: Secondary | ICD-10-CM | POA: Diagnosis present

## 2020-10-02 DIAGNOSIS — R251 Tremor, unspecified: Secondary | ICD-10-CM | POA: Diagnosis not present

## 2020-10-02 HISTORY — DX: Essential (primary) hypertension: I10

## 2020-10-02 HISTORY — DX: Disorder of thyroid, unspecified: E07.9

## 2020-10-02 LAB — CBC WITH DIFFERENTIAL/PLATELET
Abs Immature Granulocytes: 0.04 10*3/uL (ref 0.00–0.07)
Basophils Absolute: 0 10*3/uL (ref 0.0–0.1)
Basophils Relative: 0 %
Eosinophils Absolute: 0 10*3/uL (ref 0.0–0.5)
Eosinophils Relative: 0 %
HCT: 44 % (ref 36.0–46.0)
Hemoglobin: 14.6 g/dL (ref 12.0–15.0)
Immature Granulocytes: 0 %
Lymphocytes Relative: 14 %
Lymphs Abs: 1.3 10*3/uL (ref 0.7–4.0)
MCH: 29.6 pg (ref 26.0–34.0)
MCHC: 33.2 g/dL (ref 30.0–36.0)
MCV: 89.2 fL (ref 80.0–100.0)
Monocytes Absolute: 0.6 10*3/uL (ref 0.1–1.0)
Monocytes Relative: 6 %
Neutro Abs: 7.8 10*3/uL — ABNORMAL HIGH (ref 1.7–7.7)
Neutrophils Relative %: 80 %
Platelets: 188 10*3/uL (ref 150–400)
RBC: 4.93 MIL/uL (ref 3.87–5.11)
RDW: 11.9 % (ref 11.5–15.5)
WBC: 9.8 10*3/uL (ref 4.0–10.5)
nRBC: 0 % (ref 0.0–0.2)

## 2020-10-02 LAB — BASIC METABOLIC PANEL
Anion gap: 15 (ref 5–15)
BUN: 18 mg/dL (ref 6–20)
CO2: 22 mmol/L (ref 22–32)
Calcium: 9.7 mg/dL (ref 8.9–10.3)
Chloride: 100 mmol/L (ref 98–111)
Creatinine, Ser: 0.99 mg/dL (ref 0.44–1.00)
GFR, Estimated: >60 mL/min (ref >60)
Glucose, Bld: 156 mg/dL — ABNORMAL HIGH (ref 70–99)
Potassium: 3.5 mmol/L (ref 3.5–5.1)
Sodium: 137 mmol/L (ref 135–145)

## 2020-10-02 LAB — TSH: TSH: 0.27 u[IU]/mL — ABNORMAL LOW (ref 0.350–4.500)

## 2020-10-02 MED ORDER — LORAZEPAM 2 MG/ML IJ SOLN
0.5000 mg | Freq: Once | INTRAMUSCULAR | Status: AC
  Administered 2020-10-02: 0.5 mg via INTRAVENOUS
  Filled 2020-10-02: qty 1
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
## 2020-10-02 NOTE — ED Provider Notes (Signed)
[facility] EMERGENCY DEPARTMENT Provider Note   CSN: [id number] Arrival date & time: 10/02/20  1146     History Chief Complaint  Patient presents with  . Numbness    Danielle Rodriguez is a 55 y.o. female.  Patient presents ER chief complaint of bilateral hand tingling.  She states symptoms began last night and persisted to this morning.  She states she felt lightheaded and dizzy very anxious.  She states she had a similar episode several months ago lasted for several hours and then resolved.  She otherwise denies any headache denies chest pain denies abdominal pain.  Denies fevers vomiting cough or diarrhea.  She states her symptoms are improving at this time.        Past Medical History:  Diagnosis Date  . Headache(784.0)   . Hypertension   . Thyroid disease    goiter    There are no problems to display for this patient.   Past Surgical History:  Procedure Laterality Date  . HEMORROIDECTOMY    . VAGINAL HYSTERECTOMY  2003     OB History    Gravida  2   Para  2   Term  2   Preterm  0   AB  0   Living  2     SAB  0   IAB  0   Ectopic  0   Multiple  0   Live Births  2           Family History  Problem Relation Age of Onset  . Diabetes Mother   . Hypertension Mother   . Cancer Brother 50       LUNG CANCER  . Cancer Maternal Aunt   . Cancer Paternal Grandmother        BREAST  . Cancer Maternal Uncle   . Cancer Cousin     Social History   Tobacco Use  . Smoking status: Never Smoker  . Smokeless tobacco: Never Used  Vaping Use  . Vaping Use: Never used  Substance Use Topics  . Alcohol use: No  . Drug use: No    Home Medications Prior to Admission medications   Medication Sig Start Date End Date Taking? Authorizing Provider  clindamycin (CLEOCIN) 300 MG capsule Take 1 capsule (300 mg total) by mouth 2 (two) times daily. For seven days 03/18/16   Anyanwu, Jethro Bastos, MD  cyclobenzaprine (FLEXERIL) 10 MG tablet Take 1 tablet (10 mg  total) by mouth 3 (three) times daily. 12/07/17   Ivery Quale, PA-C  HYDROcodone-acetaminophen (NORCO/VICODIN) 5-325 MG tablet Take 1 tablet by mouth every 4 (four) hours as needed. 12/07/17   Ivery Quale, PA-C  methocarbamol (ROBAXIN) 500 MG tablet Take 2 tablets (1,000 mg total) by mouth 4 (four) times daily as needed for muscle spasms (muscle spasm/pain). Patient not taking: Reported on 03/17/2016 02/10/15   Samuel Jester, DO  naproxen (NAPROSYN) 250 MG tablet Take 1 tablet (250 mg total) by mouth 2 (two) times daily as needed for mild pain or moderate pain (take with food). Patient not taking: Reported on 03/17/2016 02/10/15   Samuel Jester, DO  oxyCODONE-acetaminophen (PERCOCET/ROXICET) 5-325 MG per tablet 1 or 2 tabs PO q6h prn pain Patient not taking: Reported on 03/17/2016 02/10/15   Samuel Jester, DO    Allergies    Metronidazole  Review of Systems   Review of Systems  Constitutional: Negative for fever.  HENT: Negative for ear pain.   Eyes: Negative for pain.  Respiratory: Negative  for cough.   Cardiovascular: Negative for chest pain.  Gastrointestinal: Negative for abdominal pain.  Genitourinary: Negative for flank pain.  Musculoskeletal: Negative for back pain.  Skin: Negative for rash.  Neurological: Negative for headaches.    Physical Exam Updated Vital Signs BP 106/76   Pulse 85   Temp 97.9 F (36.6 C) (Oral)   Resp 20   Ht 5\' 4"  (1.626 m)   Wt 66.7 kg   SpO2 98%   BMI 25.23 kg/m   Physical Exam Constitutional:      General: She is not in acute distress.    Appearance: Normal appearance.  HENT:     Head: Normocephalic.     Nose: Nose normal.  Eyes:     Extraocular Movements: Extraocular movements intact.  Cardiovascular:     Rate and Rhythm: Normal rate.  Pulmonary:     Effort: Pulmonary effort is normal.  Musculoskeletal:        General: Normal range of motion.     Cervical back: Normal range of motion.  Neurological:     General: No  focal deficit present.     Mental Status: She is alert and oriented to person, place, and time. Mental status is at baseline.     Cranial Nerves: No cranial nerve deficit.     Motor: No weakness.     Gait: Gait normal.     ED Results / Procedures / Treatments   Labs (all labs ordered are listed, but only abnormal results are displayed) Labs Reviewed  CBC WITH DIFFERENTIAL/PLATELET - Abnormal; Notable for the following components:      Result Value   Neutro Abs 7.8 (*)    All other components within normal limits  BASIC METABOLIC PANEL - Abnormal; Notable for the following components:   Glucose, Bld 156 (*)    All other components within normal limits  TSH - Abnormal; Notable for the following components:   TSH 0.270 (*)    All other components within normal limits    EKG EKG Interpretation  Date/Time:  Wednesday October 02 2020 12:35:47 EDT Ventricular Rate:  103 PR Interval:    QRS Duration: 82 QT Interval:  339 QTC Calculation: 444 R Axis:   86 Text Interpretation: Sinus tachycardia Baseline wander in lead(s) III Confirmed by [id number] ([id number]) on 10/02/2020 12:37:58 PM   Radiology No results found.  Procedures Procedures   Medications Ordered in ED Medications  LORazepam (ATIVAN) injection 0.5 mg (0.5 mg Intravenous Given 10/02/20 1233)    ED Course  I have reviewed the triage vital signs and the nursing notes.  Pertinent labs & imaging results that were available during my care of the patient were reviewed by me and considered in my medical decision making (see chart for details).    MDM Rules/Calculators/A&P                          On exam the patient appears awake and alert, answering all my questions appropriately.  She got denies any homicidal suicidal ideations, denies any hallucinations.  She states that she started to feel much better.  Labs otherwise unremarkable.  She does have some slightly low TSH, advising her to follow-up with her primary care  doctor regarding further thyroid studies.  Advised return if she has worsening symptoms or any additional concerns.    Final Clinical Impression(s) / ED Diagnoses Final diagnoses:  Occasional tremors    Rx / DC Orders ED  Discharge Orders    None       Cheryll Cockayne, MD 10/02/20 1434

## 2020-10-02 NOTE — Discharge Instructions (Addendum)
Call your primary care doctor or specialist as discussed in the next 2-3 days.   Return immediately back to the ER if:  Your symptoms worsen within the next 12-24 hours. You develop new symptoms such as new fevers, persistent vomiting, new pain, shortness of breath, or new weakness or numbness, or if you have any other concerns.  

## 2021-04-02 ENCOUNTER — Other Ambulatory Visit: Payer: Self-pay

## 2021-04-02 ENCOUNTER — Encounter: Payer: Self-pay | Admitting: Emergency Medicine

## 2021-04-02 ENCOUNTER — Ambulatory Visit
Admission: EM | Admit: 2021-04-02 | Discharge: 2021-04-02 | Disposition: A | Discharge status: Home / Self Care | Payer: BC Managed Care – PPO | Attending: Family Medicine | Admitting: Family Medicine

## 2021-04-02 DIAGNOSIS — M7711 Lateral epicondylitis, right elbow: Secondary | ICD-10-CM | POA: Diagnosis not present

## 2021-04-02 MED ORDER — PREDNISONE 20 MG PO TABS: 40.0000 mg | ORAL_TABLET | Freq: Every day | ORAL | 0 refills | Status: DC

## 2021-04-02 MED ORDER — KETOROLAC TROMETHAMINE 30 MG/ML IJ SOLN
30.0000 mg | Freq: Once | INTRAMUSCULAR | Status: AC
  Administered 2021-04-02: 30 mg via INTRAMUSCULAR

## 2021-04-02 NOTE — ED Triage Notes (Signed)
Right arm pain since this morning.  Arm is swollen

## 2021-04-02 NOTE — ED Provider Notes (Signed)
[facility]   [id number] 04/02/21 Arrival Time: 1630  ASSESSMENT & PLAN:  1. Lateral epicondylitis of right elbow    No indication for plain imaging of R elbow; no trauma. Will buy tennis elbow brace OTC to use for 1-2 weeks. Work note provided.  Meds ordered this encounter  Medications   ketorolac (TORADOL) 30 MG/ML injection 30 mg   predniSONE (DELTASONE) 20 MG tablet    Sig: Take 2 tablets (40 mg total) by mouth daily.    Dispense:  10 tablet    Refill:  0   Will f/u with PCP if not improving.  Reviewed expectations re: course of current medical issues. Questions answered. Outlined signs and symptoms indicating need for more acute intervention. Patient verbalized understanding. After Visit Summary given.  SUBJECTIVE: History from: patient. Danielle Rodriguez is a 55 y.o. female who reports R arm/elbow pain; noted this morning; mostly laterally; has been using arm more at work; no trauma. No extremity sensation changes or weakness. No tx PTA.   Past Surgical History:  Procedure Laterality Date   HEMORROIDECTOMY     VAGINAL HYSTERECTOMY  2003      OBJECTIVE:  Vitals:   04/02/21 1636  BP: 137/83  Pulse: 80  Resp: 18  Temp: 97.9 F (36.6 C)  TempSrc: Temporal  SpO2: 97%    General appearance: alert; no distress HEENT: [place]; AT Neck: supple with FROM Resp: unlabored respirations Extremities: RUE: warm with well perfused appearance; fairly well localized marked tenderness over right lateral epicondyle; without gross deformities; swelling: none; bruising: none; elbow ROM: normal, with discomfort CV: brisk extremity capillary refill of RUE; 2+ radial pulse of RUE. Skin: warm and dry; no visible rashes Neurologic: gait normal; normal sensation and strength of RUE Psychological: alert and cooperative; normal mood and affect   Allergies  Allergen Reactions   Metronidazole Swelling    Past Medical History:  Diagnosis Date   Headache(784.0)     Hypertension    Thyroid disease    goiter   Social History   Socioeconomic History   Marital status: Married    Spouse name: Not on file   Number of children: Not on file   Years of education: Not on file   Highest education level: Not on file  Occupational History   Not on file  Tobacco Use   Smoking status: Never   Smokeless tobacco: Never  Vaping Use   Vaping Use: Never used  Substance and Sexual Activity   Alcohol use: No   Drug use: No   Sexual activity: Yes    Partners: Male    Birth control/protection: Surgical    Comment: hysterectomy  Other Topics Concern   Not on file  Social History Narrative   Not on file   Social Determinants of Health   Financial Resource Strain: Not on file  Food Insecurity: Not on file  Transportation Needs: Not on file  Physical Activity: Not on file  Stress: Not on file  Social Connections: Not on file   Family History  Problem Relation Age of Onset   Diabetes Mother    Hypertension Mother    Cancer Brother 40       LUNG CANCER   Cancer Maternal Aunt    Cancer Paternal Grandmother        BREAST   Cancer Maternal Uncle    Cancer Cousin    Past Surgical History:  Procedure Laterality Date   HEMORROIDECTOMY     VAGINAL HYSTERECTOMY  2003       Mardella Layman, MD 04/03/21 [phone]

## 2021-07-23 ENCOUNTER — Other Ambulatory Visit: Payer: Self-pay | Admitting: Adult Health

## 2021-07-23 ENCOUNTER — Ambulatory Visit
Admission: RE | Admit: 2021-07-23 | Discharge: 2021-07-23 | Disposition: A | Discharge status: Home / Self Care | Payer: BC Managed Care – PPO | Source: Ambulatory Visit | Attending: Adult Health | Admitting: Adult Health

## 2021-07-23 ENCOUNTER — Inpatient Hospital Stay: Admit: 2021-07-23 | Payer: BC Managed Care – PPO

## 2021-07-23 DIAGNOSIS — R053 Chronic cough: Secondary | ICD-10-CM

## 2022-01-07 ENCOUNTER — Telehealth: Payer: Self-pay | Admitting: "Endocrinology

## 2022-01-07 ENCOUNTER — Encounter: Payer: Self-pay | Admitting: "Endocrinology

## 2022-01-07 ENCOUNTER — Ambulatory Visit: Payer: BC Managed Care – PPO | Admitting: "Endocrinology

## 2022-01-07 VITALS — BP 126/78 | HR 84 | Ht 64.5 in | Wt 157.6 lb

## 2022-01-07 DIAGNOSIS — R7303 Prediabetes: Secondary | ICD-10-CM

## 2022-01-07 DIAGNOSIS — R7989 Other specified abnormal findings of blood chemistry: Secondary | ICD-10-CM

## 2022-01-07 DIAGNOSIS — E042 Nontoxic multinodular goiter: Secondary | ICD-10-CM

## 2022-01-07 NOTE — Telephone Encounter (Signed)
Patient called to let you know that she is not taking Lisinopril but she is on Amlodipine 2.5 mg

## 2022-01-07 NOTE — Telephone Encounter (Signed)
Documented change in amlodipine to 2.5mg  daily and no lisinopril.

## 2022-01-07 NOTE — Progress Notes (Signed)
Endocrinology Consult Note                                            01/07/2022, 2:03 PM   Subjective:    Patient ID: Danielle Rodriguez, female    DOB: March 18, 1966, PCP Franciso Bend, NP   Past Medical History:  Diagnosis Date   Headache(784.0)    Hypertension    Thyroid disease    goiter   Past Surgical History:  Procedure Laterality Date   HEMORROIDECTOMY     VAGINAL HYSTERECTOMY  2003   Social History   Socioeconomic History   Marital status: Married    Spouse name: Not on file   Number of children: Not on file   Years of education: Not on file   Highest education level: Not on file  Occupational History   Not on file  Tobacco Use   Smoking status: Never   Smokeless tobacco: Never  Vaping Use   Vaping Use: Never used  Substance and Sexual Activity   Alcohol use: No   Drug use: No   Sexual activity: Yes    Partners: Male    Birth control/protection: Surgical    Comment: hysterectomy  Other Topics Concern   Not on file  Social History Narrative   Not on file   Social Determinants of Health   Financial Resource Strain: Not on file  Food Insecurity: Not on file  Transportation Needs: Not on file  Physical Activity: Not on file  Stress: Not on file  Social Connections: Not on file   Family History  Problem Relation Age of Onset   Diabetes Mother    Hypertension Mother    Cancer Father    Hypertension Father    Cancer Brother 61       LUNG CANCER   Cancer Maternal Aunt    Cancer Maternal Uncle    Cancer Paternal Grandmother        BREAST   Cancer Cousin    Outpatient Encounter Medications as of 01/07/2022  Medication Sig   Cholecalciferol (VITAMIN D3 PO) Take 1 tablet by mouth daily.   Multiple Vitamins-Minerals (MULTIVITAMIN WOMEN PO) Take 1 tablet by mouth daily.   Zinc 50 MG TABS Take 1 tablet by mouth daily.   amLODipine (NORVASC) 10 MG tablet Take 10 mg by mouth daily.   hydrochlorothiazide (HYDRODIURIL) 12.5 MG tablet Take  12.5 mg by mouth daily.   [DISCONTINUED] atorvastatin (LIPITOR) 10 MG tablet Take 10 mg by mouth daily.   [DISCONTINUED] predniSONE (DELTASONE) 20 MG tablet Take 2 tablets (40 mg total) by mouth daily.   No facility-administered encounter medications on file as of 01/07/2022.   ALLERGIES: Allergies  Allergen Reactions   Metronidazole Swelling    VACCINATION STATUS: Immunization History  Administered Date(s) Administered   Tdap 12/07/2017    HPI Danielle Rodriguez is 56 y.o. female who presents today with a medical history as above. she is being seen in consultation for abnormal thyroid function tests requested by Franciso Bend, NP.  History is obtained directly from the patient as well as chart review. She reveals that she was diagnosed with underactive thyroid back in 2019 which required brief intervention with Synthroid.  She did not tolerate Synthroid, has not taken any for the last year.  More recent thyroid function tests were indicative of elevated  T4 and suppressed TSH.  She is not particularly symptomatic.  She denies palpitations, however reveals heat intolerance . She denies major weight change.  She has no family history of thyroid dysfunction.  She has no family history of thyroid cancer.  Her other medical problems include hypertension on treatment with hydrochlorothiazide and amlodipine.  She also has vitamin D deficiency on supplements.  Review of Systems  Constitutional: + Minimally fluctuating body weight, no fatigue, no subjective hyperthermia, no subjective hypothermia Eyes: no blurry vision, no xerophthalmia ENT: no sore throat, no nodules palpated in throat, no dysphagia/odynophagia, no hoarseness Cardiovascular: no Chest Pain, no Shortness of Breath, no palpitations, no leg swelling Respiratory: no cough, no shortness of breath Gastrointestinal: no Nausea/Vomiting/Diarhhea Musculoskeletal: no muscle/joint aches Skin: no rashes Neurological: no tremors, no  numbness, no tingling, no dizziness Psychiatric: no depression, no anxiety  Objective:       01/07/2022    1:41 PM 04/02/2021    4:36 PM 10/02/2020    2:00 PM  Vitals with BMI  Height 5' 4.5"    Weight 157 lbs 10 oz    BMI 26.64    Systolic 126 137 638  Diastolic 78 83 76  Pulse 84 80 85    BP 126/78   Pulse 84   Ht 5' 4.5" (1.638 m)   Wt 157 lb 9.6 oz (71.5 kg)   BMI 26.63 kg/m   Wt Readings from Last 3 Encounters:  01/07/22 157 lb 9.6 oz (71.5 kg)  10/02/20 147 lb (66.7 kg)  02/12/19 170 lb (77.1 kg)    Physical Exam  Constitutional:  Body mass index is 26.63 kg/m.,  not in acute distress, normal state of mind Eyes: PERRLA, EOMI, no exophthalmos ENT: moist mucous membranes, no gross thyromegaly, no gross cervical lymphadenopathy Cardiovascular: normal precordial activity, Regular Rate and Rhythm, no Murmur/Rubs/Gallops Respiratory:  adequate breathing efforts, no gross chest deformity, Clear to auscultation bilaterally Gastrointestinal: abdomen soft, Non -tender, No distension, Bowel Sounds present, no gross organomegaly Musculoskeletal: no gross deformities, strength intact in all four extremities Skin: moist, warm, no rashes Neurological: no tremor with outstretched hands, Deep tendon reflexes normal in bilateral lower extremities.  CMP ( most recent) CMP     Component Value Date/Time   NA 137 10/02/2020 1219   K 3.5 10/02/2020 1219   CL 100 10/02/2020 1219   CO2 22 10/02/2020 1219   GLUCOSE 156 (H) 10/02/2020 1219   BUN 18 10/02/2020 1219   CREATININE 0.99 10/02/2020 1219   CREATININE 0.92 01/22/2015 1124   CALCIUM 9.7 10/02/2020 1219   PROT 7.5 04/30/2018 1851   ALBUMIN 4.2 04/30/2018 1851   AST 26 04/30/2018 1851   ALT 18 04/30/2018 1851   ALKPHOS 74 04/30/2018 1851   BILITOT 0.8 04/30/2018 1851   GFRNONAA >60 10/02/2020 1219   GFRAA >60 04/30/2018 1851      Lipid Panel ( most recent) Lipid Panel     Component Value Date/Time   CHOL 206 (H)  01/22/2015 1124   TRIG 57 01/22/2015 1124   HDL 95 01/22/2015 1124   CHOLHDL 2.2 01/22/2015 1124   VLDL 11 01/22/2015 1124   LDLCALC 100 (H) 01/22/2015 1124      Lab Results  Component Value Date   TSH 0.270 (L) 10/02/2020   TSH 0.440 01/22/2015   TSH 0.445 12/22/2013   TSH 0.611 10/27/2011   TSH 0.235 (L) 09/23/2010   TSH 0.405 02/06/2009   FREET4 1.07 02/06/2009    January 2023  labs show total T4 14.22 elevated, A1c of 6.2-prediabetes August 2022 thyroid ultrasound showed 4.15 right lobe, 4.20 left lobe.  Inferior right lobe nodule measuring 7 mm in biggest dimension, left lobe nodule measuring 6 mm in dimension.   Assessment & Plan:   1. Abnormal thyroid blood test 2. Prediabetes 3.  Multiple thyroid nodules - Danielle Rodriguez  is being seen at a kind request of Franciso Bend, NP. - I have reviewed her available thyroid records and clinically evaluated the patient. - Based on these reviews, she has multiple thyroid nodules, subcentimeter which will not need intervention at this time.  She also has abnormal thyroid function test indicating for hyperthyroidism.  However  there is not sufficient information to proceed with definitive treatment plan. She will be sent to lab for more complete thyroid function test including TSH, free T4/free T3, thyroid antibodies. She will return in 7-10 days with her results.  If results are indicative of hyperparathyroidism, she will be considered for thyroid uptake and scan.  Regarding her prediabetes: She will not need medication intervention at the time.  Whole food plant-based diet discussed and recommended to her.    Regarding her multiple subcentimeter thyroid nodules, she will not need biopsy or surgery at this time.  She will be considered for repeat thyroid ultrasound after 1 year - I did not initiate any new prescriptions today. - she is advised to maintain close follow up with Franciso Bend, NP for primary care  needs.   - Time spent with the patient: 50 minutes, of which >50% was spent in  counseling her about her abnormal thyroid function test, multiple thyroid nodules, prediabetes and the rest in obtaining information about her symptoms, reviewing her previous labs/studies ( including abstractions from other facilities),  evaluations, and treatments,  and developing a plan to confirm diagnosis and long term treatment based on the latest standards of care/guidelines; and documenting her care.  Danielle Rodriguez participated in the discussions, expressed understanding, and voiced agreement with the above plans.  All questions were answered to her satisfaction. she is encouraged to contact clinic should she have any questions or concerns prior to her return visit.  Follow up plan: Return in about 1 week (around 01/14/2022) for F/U with Pre-visit Labs, A1c -NV.   Marquis Lunch, MD [facility] [address] [place], [place] [address] Phone: [phone]  Fax: [phone]     01/07/2022, 2:03 PM  This note was partially dictated with voice recognition software. Similar sounding words can be transcribed inadequately or may not  be corrected upon review.

## 2022-01-07 NOTE — Patient Instructions (Signed)

## 2022-01-08 LAB — T3, FREE: T3, Free: 3 pg/mL (ref 2.0–4.4)

## 2022-01-08 LAB — T4, FREE: Free T4: 1.53 ng/dL (ref 0.82–1.77)

## 2022-01-08 LAB — THYROID PEROXIDASE ANTIBODY: Thyroperoxidase Ab SerPl-aCnc: 18 IU/mL (ref 0–34)

## 2022-01-08 LAB — TSH: TSH: 0.273 u[IU]/mL — ABNORMAL LOW (ref 0.450–4.500)

## 2022-01-08 LAB — THYROGLOBULIN ANTIBODY: Thyroglobulin Antibody: <1 IU/mL (ref 0.0–0.9)

## 2022-01-14 ENCOUNTER — Ambulatory Visit (INDEPENDENT_AMBULATORY_CARE_PROVIDER_SITE_OTHER): Payer: BC Managed Care – PPO | Admitting: "Endocrinology

## 2022-01-14 ENCOUNTER — Encounter: Payer: Self-pay | Admitting: "Endocrinology

## 2022-01-14 VITALS — BP 128/76 | HR 72 | Ht 64.5 in | Wt 156.2 lb

## 2022-01-14 DIAGNOSIS — R7303 Prediabetes: Secondary | ICD-10-CM | POA: Diagnosis not present

## 2022-01-14 DIAGNOSIS — R7989 Other specified abnormal findings of blood chemistry: Secondary | ICD-10-CM | POA: Diagnosis not present

## 2022-01-14 NOTE — Progress Notes (Signed)
01/14/2022, 2:08 PM  Endocrinology follow-up note   Subjective:    Patient ID: Renella Cunas, female    DOB: 02-15-1966, PCP Gae Bon, NP   Past Medical History:  Diagnosis Date   Headache(784.0)    Hypertension    Thyroid disease    goiter   Past Surgical History:  Procedure Laterality Date   HEMORROIDECTOMY     VAGINAL HYSTERECTOMY  2003   Social History   Socioeconomic History   Marital status: Married    Spouse name: Not on file   Number of children: Not on file   Years of education: Not on file   Highest education level: Not on file  Occupational History   Not on file  Tobacco Use   Smoking status: Never   Smokeless tobacco: Never  Vaping Use   Vaping Use: Never used  Substance and Sexual Activity   Alcohol use: No   Drug use: No   Sexual activity: Yes    Partners: Male    Birth control/protection: Surgical    Comment: hysterectomy  Other Topics Concern   Not on file  Social History Narrative   Not on file   Social Determinants of Health   Financial Resource Strain: Not on file  Food Insecurity: Not on file  Transportation Needs: Not on file  Physical Activity: Not on file  Stress: Not on file  Social Connections: Not on file   Family History  Problem Relation Age of Onset   Diabetes Mother    Hypertension Mother    Cancer Father    Hypertension Father    Cancer Brother 18       LUNG CANCER   Cancer Maternal Aunt    Cancer Maternal Uncle    Cancer Paternal Grandmother        BREAST   Cancer Cousin    Outpatient Encounter Medications as of 01/14/2022  Medication Sig   amLODipine (NORVASC) 2.5 MG tablet Take 2.5 mg by mouth daily.   ergocalciferol (VITAMIN D2) 1.25 MG (50000 UT) capsule Take by mouth once a week.   hydrochlorothiazide (HYDRODIURIL) 12.5 MG tablet Take 12.5 mg by mouth daily.   Multiple Vitamins-Minerals (MULTIVITAMIN WOMEN PO) Take 1 tablet by mouth daily.    Zinc 50 MG TABS Take 1 tablet by mouth daily.   [DISCONTINUED] Cholecalciferol (VITAMIN D3 PO) Take 1 tablet by mouth daily.   No facility-administered encounter medications on file as of 01/14/2022.   ALLERGIES: Allergies  Allergen Reactions   Metronidazole Swelling    VACCINATION STATUS: Immunization History  Administered Date(s) Administered   Tdap 12/07/2017    HPI Danielle Rodriguez is 56 y.o. female who presents today with a medical history as above. she is being seen in follow-up after she was seen in consultation for abnormal thyroid function tests requested by Gae Bon, NP.  History is obtained directly from the patient as well as chart review. She reveals that she was diagnosed with underactive thyroid back in 2019 which required brief intervention with Synthroid.  She did not tolerate Synthroid, has not taken any for the last year.  More recent thyroid function tests were indicative of elevated T4 and suppressed TSH.  She is  not particularly symptomatic.  She denies palpitations, however reveals heat intolerance . Her most recent labs show TSH, high normal free T4. She denies major weight change.  She has no family history of thyroid dysfunction.  She has no family history of thyroid cancer.  Her other medical problems include hypertension on treatment with hydrochlorothiazide and amlodipine.  She also has vitamin D deficiency on supplements.  Review of Systems  Constitutional: + Minimally fluctuating body weight, no fatigue, no subjective hyperthermia, no subjective hypothermia Eyes: no blurry vision, no xerophthalmia ENT: no sore throat, no nodules palpated in throat, no dysphagia/odynophagia, no hoarseness Cardiovascular: no Chest Pain, no Shortness of Breath, no palpitations, no leg swelling Respiratory: no cough, no shortness of breath Gastrointestinal: no Nausea/Vomiting/Diarhhea Musculoskeletal: no muscle/joint aches Skin: no rashes Neurological: no  tremors, no numbness, no tingling, no dizziness Psychiatric: no depression, no anxiety  Objective:       01/14/2022   10:15 AM 01/07/2022    1:41 PM 04/02/2021    4:36 PM  Vitals with BMI  Height 5' 4.5" 5' 4.5"   Weight 156 lbs 3 oz 157 lbs 10 oz   BMI 26.41 26.64   Systolic 128 126 834  Diastolic 76 78 83  Pulse 72 84 80    BP 128/76   Pulse 72   Ht 5' 4.5" (1.638 m)   Wt 156 lb 3.2 oz (70.9 kg)   BMI 26.40 kg/m   Wt Readings from Last 3 Encounters:  01/14/22 156 lb 3.2 oz (70.9 kg)  01/07/22 157 lb 9.6 oz (71.5 kg)  10/02/20 147 lb (66.7 kg)    Physical Exam  Constitutional:  Body mass index is 26.4 kg/m.,  not in acute distress, normal state of mind Eyes: PERRLA, EOMI, no exophthalmos ENT: moist mucous membranes, no gross thyromegaly, no gross cervical lymphadenopathy Cardiovascular: normal precordial activity, Regular Rate and Rhythm, no Murmur/Rubs/Gallops Respiratory:  adequate breathing efforts, no gross chest deformity, Clear to auscultation bilaterally Gastrointestinal: abdomen soft, Non -tender, No distension, Bowel Sounds present, no gross organomegaly Musculoskeletal: no gross deformities, strength intact in all four extremities Skin: moist, warm, no rashes Neurological: no tremor with outstretched hands, Deep tendon reflexes normal in bilateral lower extremities.  CMP ( most recent) CMP     Component Value Date/Time   NA 137 10/02/2020 1219   K 3.5 10/02/2020 1219   CL 100 10/02/2020 1219   CO2 22 10/02/2020 1219   GLUCOSE 156 (H) 10/02/2020 1219   BUN 18 10/02/2020 1219   CREATININE 0.99 10/02/2020 1219   CREATININE 0.92 01/22/2015 1124   CALCIUM 9.7 10/02/2020 1219   PROT 7.5 04/30/2018 1851   ALBUMIN 4.2 04/30/2018 1851   AST 26 04/30/2018 1851   ALT 18 04/30/2018 1851   ALKPHOS 74 04/30/2018 1851   BILITOT 0.8 04/30/2018 1851   GFRNONAA >60 10/02/2020 1219   GFRAA >60 04/30/2018 1851      Lipid Panel ( most recent) Lipid Panel      Component Value Date/Time   CHOL 206 (H) 01/22/2015 1124   TRIG 57 01/22/2015 1124   HDL 95 01/22/2015 1124   CHOLHDL 2.2 01/22/2015 1124   VLDL 11 01/22/2015 1124   LDLCALC 100 (H) 01/22/2015 1124      Lab Results  Component Value Date   TSH 0.273 (L) 01/07/2022   TSH 0.270 (L) 10/02/2020   TSH 0.440 01/22/2015   TSH 0.445 12/22/2013   TSH 0.611 10/27/2011   TSH 0.235 (L) 09/23/2010  TSH 0.405 02/06/2009   FREET4 1.53 01/07/2022   FREET4 1.07 02/06/2009    January 2023 labs show total T4 14.22 elevated, A1c of 6.2-prediabetes August 2022 thyroid ultrasound showed 4.15 right lobe, 4.20 left lobe.  Inferior right lobe nodule measuring 7 mm in biggest dimension, left lobe nodule measuring 6 mm in dimension.   Assessment & Plan:   1. Abnormal thyroid blood test 2. Prediabetes 3.  Multiple thyroid nodules  - I have reviewed and discussed her recent and existing thyroid function tests and clinically evaluated the patient.  - Based on these reviews, she has multiple thyroid nodules, subcentimeter which will not need intervention at this time.  She also has abnormal thyroid function test indicating mild hyperthyroidism.  However, she will need confirmatory thyroid uptake and scan before definitive treatment   She will return in 15 days with her thyroid uptake and scan results.    Regarding her prediabetes: She will not need medication intervention at the time.  Whole food plant-based diet discussed and recommended to her.    Regarding her multiple subcentimeter thyroid nodules, she will not need biopsy or surgery at this time.  He is higher uptake and scan shows cold nodules significant clinically, she will be considered for fine-needle aspiration biopsy.    She will be considered for repeat thyroid ultrasound after 1 year  - she is advised to maintain close follow up with Franciso Bend, NP for primary care needs.   I spent 21 minutes in the care of the patient today  including review of labs from Thyroid Function, CMP, and other relevant labs ; imaging/biopsy records (current and previous including abstractions from other facilities); face-to-face time discussing  her lab results and symptoms, medications doses, her options of short and long term treatment based on the latest standards of care / guidelines;   and documenting the encounter.  Roderick Pee  participated in the discussions, expressed understanding, and voiced agreement with the above plans.  All questions were answered to her satisfaction. she is encouraged to contact clinic should she have any questions or concerns prior to her return visit.   Follow up plan: Return in about 2 weeks (around 01/28/2022) for F/U with Thyroid Uptake and Scan.   Marquis Lunch, MD [facility] [address] [place], [place] [address] Phone: [phone]  Fax: [phone]     01/14/2022, 2:08 PM  This note was partially dictated with voice recognition software. Similar sounding words can be transcribed inadequately or may not  be corrected upon review.

## 2022-01-27 ENCOUNTER — Encounter (HOSPITAL_COMMUNITY)
Admission: RE | Admit: 2022-01-27 | Discharge: 2022-01-27 | Disposition: A | Discharge status: Home / Self Care | Payer: BC Managed Care – PPO | Source: Ambulatory Visit | Attending: "Endocrinology | Admitting: "Endocrinology

## 2022-01-27 ENCOUNTER — Encounter (HOSPITAL_COMMUNITY): Payer: Self-pay

## 2022-01-27 DIAGNOSIS — L659 Nonscarring hair loss, unspecified: Secondary | ICD-10-CM | POA: Insufficient documentation

## 2022-01-27 DIAGNOSIS — R7989 Other specified abnormal findings of blood chemistry: Secondary | ICD-10-CM | POA: Insufficient documentation

## 2022-01-27 DIAGNOSIS — R5383 Other fatigue: Secondary | ICD-10-CM | POA: Diagnosis not present

## 2022-01-27 MED ORDER — SODIUM IODIDE I 131 CAPSULE
10.0000 | Freq: Once | INTRAVENOUS | Status: AC | PRN
  Administered 2022-01-27: 10.2 via ORAL

## 2022-01-28 ENCOUNTER — Encounter (HOSPITAL_COMMUNITY): Payer: Self-pay

## 2022-01-28 ENCOUNTER — Encounter (HOSPITAL_COMMUNITY)
Admission: RE | Admit: 2022-01-28 | Discharge: 2022-01-28 | Disposition: A | Discharge status: Home / Self Care | Payer: BC Managed Care – PPO | Source: Ambulatory Visit | Attending: "Endocrinology | Admitting: "Endocrinology

## 2022-01-28 MED ORDER — SODIUM PERTECHNETATE TC 99M INJECTION
10.0000 | Freq: Once | INTRAVENOUS | Status: AC | PRN
  Administered 2022-01-28: 9.7 via INTRAVENOUS

## 2022-02-02 ENCOUNTER — Ambulatory Visit (INDEPENDENT_AMBULATORY_CARE_PROVIDER_SITE_OTHER): Payer: BC Managed Care – PPO | Admitting: "Endocrinology

## 2022-02-02 ENCOUNTER — Encounter: Payer: Self-pay | Admitting: "Endocrinology

## 2022-02-02 VITALS — BP 110/76 | HR 80 | Ht 64.5 in | Wt 152.8 lb

## 2022-02-02 DIAGNOSIS — R7989 Other specified abnormal findings of blood chemistry: Secondary | ICD-10-CM

## 2022-02-02 DIAGNOSIS — E042 Nontoxic multinodular goiter: Secondary | ICD-10-CM | POA: Diagnosis not present

## 2022-02-02 DIAGNOSIS — R7303 Prediabetes: Secondary | ICD-10-CM

## 2022-02-02 NOTE — Progress Notes (Signed)
02/02/2022, 10:37 AM   Endocrinology follow-up note  Subjective:    Patient ID: Danielle Rodriguez, female    DOB: 1966/05/01, PCP Franciso Bend, NP   Past Medical History:  Diagnosis Date   Headache(784.0)    Hypertension    Thyroid disease    goiter   Past Surgical History:  Procedure Laterality Date   HEMORROIDECTOMY     VAGINAL HYSTERECTOMY  2003   Social History   Socioeconomic History   Marital status: Married    Spouse name: Not on file   Number of children: Not on file   Years of education: Not on file   Highest education level: Not on file  Occupational History   Not on file  Tobacco Use   Smoking status: Never   Smokeless tobacco: Never  Vaping Use   Vaping Use: Never used  Substance and Sexual Activity   Alcohol use: No   Drug use: No   Sexual activity: Yes    Partners: Male    Birth control/protection: Surgical    Comment: hysterectomy  Other Topics Concern   Not on file  Social History Narrative   Not on file   Social Determinants of Health   Financial Resource Strain: Not on file  Food Insecurity: Not on file  Transportation Needs: Not on file  Physical Activity: Not on file  Stress: Not on file  Social Connections: Not on file   Family History  Problem Relation Age of Onset   Diabetes Mother    Hypertension Mother    Cancer Father    Hypertension Father    Cancer Brother 27       LUNG CANCER   Cancer Maternal Aunt    Cancer Maternal Uncle    Cancer Paternal Grandmother        BREAST   Cancer Cousin    Outpatient Encounter Medications as of 02/02/2022  Medication Sig   Cholecalciferol (VITAMIN D3 PO) Take 50,000 Units by mouth once a week.   hydrochlorothiazide (HYDRODIURIL) 12.5 MG tablet Take 12.5 mg by mouth daily.   Multiple Vitamins-Minerals (MULTIVITAMIN WOMEN PO) Take 1 tablet by mouth daily.   Zinc 50 MG TABS Take 1 tablet by mouth daily.   [DISCONTINUED] amLODipine  (NORVASC) 2.5 MG tablet Take 2.5 mg by mouth daily.   [DISCONTINUED] ergocalciferol (VITAMIN D2) 1.25 MG (50000 UT) capsule Take by mouth once a week.   No facility-administered encounter medications on file as of 02/02/2022.   ALLERGIES: Allergies  Allergen Reactions   Metronidazole Swelling    VACCINATION STATUS: Immunization History  Administered Date(s) Administered   Tdap 12/07/2017    HPI Danielle Rodriguez is 56 y.o. female who presents today with a medical history as above. she is being seen in follow-up after she was seen in consultation for abnormal thyroid function tests requested by Franciso Bend, NP.  She was sent for thyroid uptake and scan after her last visit.  Her scan is consistent with normal findings.  She has no new complaints today.  See notes from previous visit. She reveals that she was diagnosed with underactive thyroid back in 2019 which required brief intervention with Synthroid.  She did not tolerate Synthroid, has not taken any for  the last year.  More recent thyroid function tests were indicative of elevated T4 and suppressed TSH.  She is not particularly symptomatic.  She denies palpitations, tremors.    She denies palpitations, however reveals heat intolerance . She denies major weight change.  She has no family history of thyroid dysfunction.  She has no family history of thyroid cancer.  Her other medical problems include hypertension on treatment with hydrochlorothiazide and amlodipine.  She also has vitamin D deficiency on supplements.  Review of Systems  Constitutional: + Minimally fluctuating body weight, no fatigue, no subjective hyperthermia, no subjective hypothermia Eyes: no blurry vision, no xerophthalmia ENT: no sore throat, no nodules palpated in throat, no dysphagia/odynophagia, no hoarseness Cardiovascular: no Chest Pain, no Shortness of Breath, no palpitations, no leg swelling Respiratory: no cough, no shortness of  breath Gastrointestinal: no Nausea/Vomiting/Diarhhea Musculoskeletal: no muscle/joint aches Skin: no rashes Neurological: no tremors, no numbness, no tingling, no dizziness Psychiatric: no depression, no anxiety  Objective:       02/02/2022    8:34 AM 01/14/2022   10:15 AM 01/07/2022    1:41 PM  Vitals with BMI  Height 5' 4.5" 5' 4.5" 5' 4.5"  Weight 152 lbs 13 oz 156 lbs 3 oz 157 lbs 10 oz  BMI 25.83 26.41 26.64  Systolic 110 128 185  Diastolic 76 76 78  Pulse 80 72 84    BP 110/76   Pulse 80   Ht 5' 4.5" (1.638 m)   Wt 152 lb 12.8 oz (69.3 kg)   BMI 25.82 kg/m   Wt Readings from Last 3 Encounters:  02/02/22 152 lb 12.8 oz (69.3 kg)  01/14/22 156 lb 3.2 oz (70.9 kg)  01/07/22 157 lb 9.6 oz (71.5 kg)    Physical Exam  Constitutional:  Body mass index is 25.82 kg/m.,  not in acute distress, normal state of mind Eyes: PERRLA, EOMI, no exophthalmos ENT: moist mucous membranes, no gross thyromegaly, no gross cervical lymphadenopathy   CMP ( most recent) CMP     Component Value Date/Time   NA 137 10/02/2020 1219   K 3.5 10/02/2020 1219   CL 100 10/02/2020 1219   CO2 22 10/02/2020 1219   GLUCOSE 156 (H) 10/02/2020 1219   BUN 18 10/02/2020 1219   CREATININE 0.99 10/02/2020 1219   CREATININE 0.92 01/22/2015 1124   CALCIUM 9.7 10/02/2020 1219   PROT 7.5 04/30/2018 1851   ALBUMIN 4.2 04/30/2018 1851   AST 26 04/30/2018 1851   ALT 18 04/30/2018 1851   ALKPHOS 74 04/30/2018 1851   BILITOT 0.8 04/30/2018 1851   GFRNONAA >60 10/02/2020 1219   GFRAA >60 04/30/2018 1851      Lipid Panel ( most recent) Lipid Panel     Component Value Date/Time   CHOL 206 (H) 01/22/2015 1124   TRIG 57 01/22/2015 1124   HDL 95 01/22/2015 1124   CHOLHDL 2.2 01/22/2015 1124   VLDL 11 01/22/2015 1124   LDLCALC 100 (H) 01/22/2015 1124      Lab Results  Component Value Date   TSH 0.273 (L) 01/07/2022   TSH 0.270 (L) 10/02/2020   TSH 0.440 01/22/2015   TSH 0.445 12/22/2013    TSH 0.611 10/27/2011   TSH 0.235 (L) 09/23/2010   TSH 0.405 02/06/2009   FREET4 1.53 01/07/2022   FREET4 1.07 02/06/2009    January 2023 labs show total T4 14.22 elevated, A1c of 6.2-prediabetes  August 2022 thyroid ultrasound showed 4.15 right lobe, 4.20 left lobe.  Inferior right lobe nodule measuring 7 mm in biggest dimension, left lobe nodule measuring 6 mm in dimension.  Thyroid uptake and scan on January 28, 2022: FINDINGS: Homogeneous tracer distribution in both thyroid lobes.   No focal areas of increased or decreased tracer uptake seen.   4 hour I-131 uptake = 9.4% (normal 5-20%),   24 hour I-131 uptake = 23.5% (normal 10-30%)   IMPRESSION: Normal exam.    Assessment & Plan:   1. Abnormal thyroid blood test 2. Prediabetes 3.  Multiple thyroid nodules I discussed her recent labs as well as thyroid uptake and scan results with her. Based on these reviews, she does not have clinically significant primary hyperthyroidism to warrant ablative treatment. She will be observed with repeat thyroid function test and office visit in 6 months.  Regarding her prediabetes: She will not need medication intervention at the time.  Whole food plant-based diet discussed and recommended to her.  She presents with 5 pounds weight loss.  Regarding her multiple subcentimeter thyroid nodules, she will not need biopsy or surgery at this time.  She will be considered for repeat thyroid ultrasound after 1 year.  - I did not initiate any new prescriptions today. - she is advised to maintain close follow up with Franciso Bend, NP for primary care needs.    I spent 21 minutes in the care of the patient today including review of labs from Thyroid Function, CMP, and other relevant labs ; imaging/biopsy records (current and previous including abstractions from other facilities); face-to-face time discussing  her lab results and symptoms, medications doses, her options of short and long term  treatment based on the latest standards of care / guidelines;   and documenting the encounter.  Danielle Rodriguez  participated in the discussions, expressed understanding, and voiced agreement with the above plans.  All questions were answered to her satisfaction. she is encouraged to contact clinic should she have any questions or concerns prior to her return visit.   Follow up plan: Return in about 6 months (around 08/05/2022) for F/U with Pre-visit Labs, A1c -NV.   Marquis Lunch, MD [facility] [address] [place], [place] [address] Phone: [phone]  Fax: [phone]     02/02/2022, 10:37 AM  This note was partially dictated with voice recognition software. Similar sounding words can be transcribed inadequately or may not  be corrected upon review.

## 2022-02-02 NOTE — Patient Instructions (Signed)

## 2022-07-30 ENCOUNTER — Ambulatory Visit
Admission: EM | Admit: 2022-07-30 | Discharge: 2022-07-30 | Disposition: A | Discharge status: Home / Self Care | Payer: BC Managed Care – PPO

## 2022-07-30 ENCOUNTER — Encounter: Payer: Self-pay | Admitting: Emergency Medicine

## 2022-07-30 DIAGNOSIS — J069 Acute upper respiratory infection, unspecified: Secondary | ICD-10-CM | POA: Diagnosis not present

## 2022-07-30 MED ORDER — BENZONATATE 100 MG PO CAPS: 100.0000 mg | ORAL_CAPSULE | Freq: Three times a day (TID) | ORAL | 0 refills | Status: DC | PRN

## 2022-07-30 NOTE — ED Triage Notes (Signed)
Productive Cough x 5 days with green sputum

## 2022-07-30 NOTE — Discharge Instructions (Addendum)
You have a viral upper respiratory infection.  Symptoms should improve over the next week to 10 days.  If you develop chest pain or shortness of breath, go to the emergency room.  Some things that can make you feel better are: - Increased rest - Increasing fluid with water/sugar free electrolytes - Acetaminophen and ibuprofen as needed for fever/pain - Salt water gargling, chloraseptic spray and throat lozenges for sore throat - OTC guaifenesin (Mucinex) 600 mg twice daily for congestion - Saline sinus flushes or a neti pot for congestion - Humidifying the air -Tessalon Perles every 8 hours during the day as needed for dry cough

## 2022-07-30 NOTE — ED Provider Notes (Signed)
[facility]    CSN: [id number] Arrival date & time: 07/30/22  1345      History   Chief Complaint No chief complaint on file.   HPI Danielle Rodriguez is a 57 y.o. female.   Patient presents today for 5-day history of productive cough, chest and nasal congestion, headache, right ear pressure when blowing her nose, and fatigue.  She denies known fevers at home, shortness of breath or chest pain, runny nose, postnasal drainage, sore throat, abdominal pain, nausea/vomiting, diarrhea, decreased appetite, loss of taste or smell, and new rash.  Has been taking DayQuil, tried warm teas and lemon with minimal improvement.  Reports her symptoms are slowly improving, however she still has a productive cough and wants to "knock it out."  Patient denies history of chronic lung disease.  Denies history of asthma.  Does not smoke or vape and never has.    Past Medical History:  Diagnosis Date   Headache(784.0)    Hypertension    Thyroid disease    goiter    Patient Active Problem List   Diagnosis Date Noted   Abnormal thyroid blood test 01/07/2022   Prediabetes 01/07/2022   Multinodular goiter 01/07/2022    Past Surgical History:  Procedure Laterality Date   HEMORROIDECTOMY     VAGINAL HYSTERECTOMY  2003    OB History     Gravida  2   Para  2   Term  2   Preterm  0   AB  0   Living  2      SAB  0   IAB  0   Ectopic  0   Multiple  0   Live Births  2            Home Medications    Prior to Admission medications   Medication Sig Start Date End Date Taking? Authorizing Provider  amLODipine (NORVASC) 2.5 MG tablet Take 2.5 mg by mouth daily.   Yes [provider]  benzonatate (TESSALON) 100 MG capsule Take 1 capsule (100 mg total) by mouth 3 (three) times daily as needed for cough. Do not take with alcohol or while driving or operating heavy machinery.  May cause drowsiness. 07/30/22  Yes Eulogio Bear, NP  Cholecalciferol  (VITAMIN D3 PO) Take 50,000 Units by mouth once a week.    [provider]  hydrochlorothiazide (HYDRODIURIL) 12.5 MG tablet Take 12.5 mg by mouth daily.    [provider]  Multiple Vitamins-Minerals (MULTIVITAMIN WOMEN PO) Take 1 tablet by mouth daily.    [provider]  Zinc 50 MG TABS Take 1 tablet by mouth daily.    [provider]    Family History Family History  Problem Relation Age of Onset   Diabetes Mother    Hypertension Mother    Cancer Father    Hypertension Father    Cancer Brother 49       LUNG CANCER   Cancer Maternal Aunt    Cancer Maternal Uncle    Cancer Paternal Grandmother        BREAST   Cancer Cousin     Social History Social History   Tobacco Use   Smoking status: Never   Smokeless tobacco: Never  Vaping Use   Vaping Use: Never used  Substance Use Topics   Alcohol use: No   Drug use: No     Allergies   Metronidazole   Review of Systems Review of Systems Per HPI  Physical Exam Triage Vital Signs ED Triage Vitals  Enc Vitals Group     BP 07/30/22 1350 (!) 146/80     Pulse Rate 07/30/22 1350 95     Resp 07/30/22 1350 18     Temp 07/30/22 1350 99.2 F (37.3 C)     Temp Source 07/30/22 1350 Oral     SpO2 07/30/22 1350 96 %     Weight --      Height --      Head Circumference --      Peak Flow --      Pain Score 07/30/22 1352 0     Pain Loc --      Pain Edu? --      Excl. in GC? --    No data found.  Updated Vital Signs BP (!) 146/80 (BP Location: Right Arm)   Pulse 95   Temp 99.2 F (37.3 C) (Oral)   Resp 18   SpO2 96%   Visual Acuity Right Eye Distance:   Left Eye Distance:   Bilateral Distance:    Right Eye Near:   Left Eye Near:    Bilateral Near:     Physical Exam Vitals and nursing note reviewed.  Constitutional:      General: She is not in acute distress.    Appearance: Normal appearance. She is not ill-appearing or toxic-appearing.  HENT:     Head: Normocephalic and  atraumatic.     Right Ear: Ear canal and external ear normal. A middle ear effusion is present.     Left Ear: Ear canal and external ear normal. A middle ear effusion is present.     Nose: Congestion and rhinorrhea present.     Right Sinus: No maxillary sinus tenderness or frontal sinus tenderness.     Left Sinus: No maxillary sinus tenderness or frontal sinus tenderness.     Mouth/Throat:     Mouth: Mucous membranes are moist.     Pharynx: Oropharynx is clear. Posterior oropharyngeal erythema present. No oropharyngeal exudate.  Eyes:     General: No scleral icterus.    Extraocular Movements: Extraocular movements intact.  Cardiovascular:     Rate and Rhythm: Normal rate and regular rhythm.  Pulmonary:     Effort: Pulmonary effort is normal. No respiratory distress.     Breath sounds: Normal breath sounds. No wheezing, rhonchi or rales.  Abdominal:     General: Abdomen is flat. Bowel sounds are normal. There is no distension.     Palpations: Abdomen is soft.     Tenderness: There is no abdominal tenderness. There is no guarding.  Musculoskeletal:     Cervical back: Normal range of motion and neck supple.  Lymphadenopathy:     Cervical: No cervical adenopathy.  Skin:    General: Skin is warm and dry.     Coloration: Skin is not jaundiced or pale.     Findings: No erythema or rash.  Neurological:     Mental Status: She is alert and oriented to person, place, and time.  Psychiatric:        Behavior: Behavior is cooperative.      UC Treatments / Results  Labs (all labs ordered are listed, but only abnormal results are displayed) Labs Reviewed - No data to display  EKG   Radiology No results found.  Procedures Procedures (including critical care time)  Medications Ordered in UC Medications - No data to display  Initial Impression / Assessment and Plan / UC  Course  I have reviewed the triage vital signs and the nursing notes.  Pertinent labs & imaging results that  were available during my care of the patient were reviewed by me and considered in my medical decision making (see chart for details).   Patient is well-appearing, normotensive, afebrile, not tachycardic, not tachypneic, oxygenating well on room air.    Viral URI with cough Suspect viral etiology Given length of symptoms, viral testing deferred Supportive care discussed with patient-specifically recommended starting Mucinex and drinking lots of water Can also use Flonase to help with fluid behind eardrums Start Tessalon Perles as needed for dry cough ER and return precautions discussed with patient  The patient was given the opportunity to ask questions.  All questions answered to their satisfaction.  The patient is in agreement to this plan.    Final Clinical Impressions(s) / UC Diagnoses   Final diagnoses:  Viral URI with cough     Discharge Instructions      You have a viral upper respiratory infection.  Symptoms should improve over the next week to 10 days.  If you develop chest pain or shortness of breath, go to the emergency room.  Some things that can make you feel better are: - Increased rest - Increasing fluid with water/sugar free electrolytes - Acetaminophen and ibuprofen as needed for fever/pain - Salt water gargling, chloraseptic spray and throat lozenges for sore throat - OTC guaifenesin (Mucinex) 600 mg twice daily for congestion - Saline sinus flushes or a neti pot for congestion - Humidifying the air -Tessalon Perles every 8 hours during the day as needed for dry cough     ED Prescriptions     Medication Sig Dispense Auth. Provider   benzonatate (TESSALON) 100 MG capsule Take 1 capsule (100 mg total) by mouth 3 (three) times daily as needed for cough. Do not take with alcohol or while driving or operating heavy machinery.  May cause drowsiness. 21 capsule Eulogio Bear, NP      PDMP not reviewed this encounter.   Eulogio Bear, NP 07/30/22  1421

## 2022-08-05 ENCOUNTER — Ambulatory Visit: Payer: BC Managed Care – PPO | Admitting: "Endocrinology

## 2022-08-26 ENCOUNTER — Ambulatory Visit: Payer: BC Managed Care – PPO | Admitting: "Endocrinology

## 2022-10-09 ENCOUNTER — Ambulatory Visit: Payer: BC Managed Care – PPO | Admitting: "Endocrinology

## 2023-02-16 ENCOUNTER — Emergency Department (HOSPITAL_COMMUNITY)
Admission: EM | Admit: 2023-02-16 | Discharge: 2023-02-16 | Disposition: A | Discharge status: Home / Self Care | Payer: BC Managed Care – PPO | Attending: Emergency Medicine | Admitting: Emergency Medicine

## 2023-02-16 ENCOUNTER — Emergency Department (HOSPITAL_COMMUNITY): Payer: BC Managed Care – PPO

## 2023-02-16 ENCOUNTER — Encounter (HOSPITAL_COMMUNITY): Payer: Self-pay | Admitting: Pharmacy Technician

## 2023-02-16 ENCOUNTER — Other Ambulatory Visit: Payer: Self-pay

## 2023-02-16 DIAGNOSIS — I10 Essential (primary) hypertension: Secondary | ICD-10-CM | POA: Diagnosis not present

## 2023-02-16 DIAGNOSIS — R059 Cough, unspecified: Secondary | ICD-10-CM | POA: Diagnosis present

## 2023-02-16 DIAGNOSIS — N3000 Acute cystitis without hematuria: Secondary | ICD-10-CM | POA: Insufficient documentation

## 2023-02-16 DIAGNOSIS — R051 Acute cough: Secondary | ICD-10-CM | POA: Diagnosis not present

## 2023-02-16 DIAGNOSIS — Z79899 Other long term (current) drug therapy: Secondary | ICD-10-CM | POA: Diagnosis not present

## 2023-02-16 DIAGNOSIS — Z1152 Encounter for screening for COVID-19: Secondary | ICD-10-CM | POA: Diagnosis not present

## 2023-02-16 LAB — CBC WITH DIFFERENTIAL/PLATELET
Abs Immature Granulocytes: 0.02 10*3/uL (ref 0.00–0.07)
Basophils Absolute: 0 10*3/uL (ref 0.0–0.1)
Basophils Relative: 0 %
Eosinophils Absolute: 0.1 10*3/uL (ref 0.0–0.5)
Eosinophils Relative: 2 %
HCT: 41 % (ref 36.0–46.0)
Hemoglobin: 13.3 g/dL (ref 12.0–15.0)
Immature Granulocytes: 0 %
Lymphocytes Relative: 30 %
Lymphs Abs: 2 10*3/uL (ref 0.7–4.0)
MCH: 28.5 pg (ref 26.0–34.0)
MCHC: 32.4 g/dL (ref 30.0–36.0)
MCV: 88 fL (ref 80.0–100.0)
Monocytes Absolute: 0.7 10*3/uL (ref 0.1–1.0)
Monocytes Relative: 11 %
Neutro Abs: 3.7 10*3/uL (ref 1.7–7.7)
Neutrophils Relative %: 57 %
Platelets: 173 10*3/uL (ref 150–400)
RBC: 4.66 MIL/uL (ref 3.87–5.11)
RDW: 12 % (ref 11.5–15.5)
WBC: 6.5 10*3/uL (ref 4.0–10.5)
nRBC: 0 % (ref 0.0–0.2)

## 2023-02-16 LAB — URINALYSIS, ROUTINE W REFLEX MICROSCOPIC
Bilirubin Urine: NEGATIVE
Glucose, UA: NEGATIVE mg/dL
Hgb urine dipstick: NEGATIVE
Ketones, ur: NEGATIVE mg/dL
Nitrite: POSITIVE — AB
Protein, ur: NEGATIVE mg/dL
Specific Gravity, Urine: 1.02 (ref 1.005–1.030)
pH: 5 (ref 5.0–8.0)

## 2023-02-16 LAB — SARS CORONAVIRUS 2 BY RT PCR: SARS Coronavirus 2 by RT PCR: NEGATIVE

## 2023-02-16 LAB — BASIC METABOLIC PANEL
Anion gap: 6 (ref 5–15)
BUN: 12 mg/dL (ref 6–20)
CO2: 27 mmol/L (ref 22–32)
Calcium: 8.7 mg/dL — ABNORMAL LOW (ref 8.9–10.3)
Chloride: 106 mmol/L (ref 98–111)
Creatinine, Ser: 1.01 mg/dL — ABNORMAL HIGH (ref 0.44–1.00)
GFR, Estimated: >60 mL/min (ref >60)
Glucose, Bld: 125 mg/dL — ABNORMAL HIGH (ref 70–99)
Potassium: 3.6 mmol/L (ref 3.5–5.1)
Sodium: 139 mmol/L (ref 135–145)

## 2023-02-16 MED ORDER — BENZONATATE 200 MG PO CAPS: 200.0000 mg | ORAL_CAPSULE | Freq: Three times a day (TID) | ORAL | 0 refills | Status: AC | PRN

## 2023-02-16 MED ORDER — CEPHALEXIN 500 MG PO CAPS
500.0000 mg | ORAL_CAPSULE | Freq: Once | ORAL | Status: AC
  Administered 2023-02-16: 500 mg via ORAL
  Filled 2023-02-16: qty 1

## 2023-02-16 MED ORDER — CEPHALEXIN 500 MG PO CAPS: 500.0000 mg | ORAL_CAPSULE | Freq: Four times a day (QID) | ORAL | 0 refills | Status: AC

## 2023-02-16 NOTE — Discharge Instructions (Signed)
Your workup today shows that you have a urinary tract infection and a cough without evidence of pneumonia.  Please take the antibiotic as directed until finished.  Drink plenty of water over the next few days.  you may take Tylenol every 4 hours as needed for body aches or fever.  Follow-up with your primary care for recheck if needed.  Return emergency department for any new or worsening symptoms.

## 2023-02-16 NOTE — ED Triage Notes (Signed)
Pt here with congestion, increased phlegm, cough, headache. States she felt the cold coming on over the last week, but today symptoms intensified.

## 2023-02-16 NOTE — ED Provider Notes (Signed)
[place] EMERGENCY DEPARTMENT AT [facility] Provider Note   CSN: [id number] Arrival date & time: 02/16/23  4098     History  Chief Complaint  Patient presents with   URI    Danielle Rodriguez is a 57 y.o. female.   URI Presenting symptoms: cough   Presenting symptoms: no ear pain, no fever, no rhinorrhea and no sore throat   Associated symptoms: headaches   Associated symptoms: no wheezing        Danielle Rodriguez is a 57 y.o. female with hx of thyroid disease and HTN who presents to the Emergency Department complaining of chest congestion, cough, diffuse headache.  States she has been working in an area where the [facility] is constantly blowing on her.  She felt like she was getting a cold last week.  She woke this morning with worsening chest congestion and hoarseness in her voice.  She denies fever, chest pain, shortness of breath, nasal congestion and sore throat.   She also noticed dark colored urine recently and is requesting to have her "urine and kidneys" checked for infection.  No flank pain or pain with urination     Home Medications Prior to Admission medications   Medication Sig Start Date End Date Taking? Authorizing Provider  amLODipine (NORVASC) 2.5 MG tablet Take 2.5 mg by mouth daily.    [provider]  benzonatate (TESSALON) 100 MG capsule Take 1 capsule (100 mg total) by mouth 3 (three) times daily as needed for cough. Do not take with alcohol or while driving or operating heavy machinery.  May cause drowsiness. 07/30/22   Valentino Nose, NP  Cholecalciferol (VITAMIN D3 PO) Take 50,000 Units by mouth once a week.    [provider]  hydrochlorothiazide (HYDRODIURIL) 12.5 MG tablet Take 12.5 mg by mouth daily.    [provider]  Multiple Vitamins-Minerals (MULTIVITAMIN WOMEN PO) Take 1 tablet by mouth daily.    [provider]  Zinc 50 MG TABS Take 1 tablet by mouth daily.    [provider]       Allergies    Metronidazole    Review of Systems   Review of Systems  Constitutional:  Negative for appetite change, chills and fever.  HENT:  Negative for ear pain, rhinorrhea, sinus pressure, sore throat and trouble swallowing.   Respiratory:  Positive for cough. Negative for shortness of breath and wheezing.   Cardiovascular:  Negative for chest pain.  Gastrointestinal:  Negative for abdominal pain, diarrhea, nausea and vomiting.  Genitourinary:  Negative for dysuria, flank pain, frequency, hematuria and pelvic pain.       Dark colored urine  Neurological:  Positive for headaches. Negative for dizziness and weakness.    Physical Exam Updated Vital Signs BP (!) 150/84   Pulse 86   Temp 98.2 F (36.8 C)   Resp 16   SpO2 100%  Physical Exam Vitals and nursing note reviewed.  Constitutional:      General: She is not in acute distress.    Appearance: Normal appearance. She is not ill-appearing or toxic-appearing.  HENT:     Right Ear: Tympanic membrane and ear canal normal.     Left Ear: Tympanic membrane and ear canal normal.     Mouth/Throat:     Mouth: Mucous membranes are moist.     Pharynx: Oropharynx is clear. No posterior oropharyngeal erythema.  Cardiovascular:     Rate and Rhythm: Normal rate and regular rhythm.  Pulses: Normal pulses.  Pulmonary:     Effort: Pulmonary effort is normal. No respiratory distress.     Breath sounds: No wheezing, rhonchi or rales.  Abdominal:     Palpations: Abdomen is soft.     Tenderness: There is no abdominal tenderness.  Musculoskeletal:        General: Normal range of motion.     Cervical back: Normal range of motion. No rigidity.  Lymphadenopathy:     Cervical: No cervical adenopathy.  Skin:    General: Skin is warm.     Capillary Refill: Capillary refill takes less than 2 seconds.  Neurological:     General: No focal deficit present.     Mental Status: She is alert.     Sensory: No sensory deficit.     Motor: No  weakness.     ED Results / Procedures / Treatments   Labs (all labs ordered are listed, but only abnormal results are displayed) Labs Reviewed  BASIC METABOLIC PANEL - Abnormal; Notable for the following components:      Result Value   Glucose, Bld 125 (*)    Creatinine, Ser 1.01 (*)    Calcium 8.7 (*)    All other components within normal limits  URINALYSIS, ROUTINE W REFLEX MICROSCOPIC - Abnormal; Notable for the following components:   APPearance HAZY (*)    Nitrite POSITIVE (*)    Leukocytes,Ua TRACE (*)    Bacteria, UA MANY (*)    All other components within normal limits  SARS CORONAVIRUS 2 BY RT PCR  URINE CULTURE  CBC WITH DIFFERENTIAL/PLATELET    EKG None  Radiology DG Chest 2 View  Result Date: 02/16/2023 CLINICAL DATA:  Cough for 2 days. EXAM: CHEST - 2 VIEW COMPARISON:  X-ray 07/23/2021 FINDINGS: The heart size and mediastinal contours are within normal limits. Both lungs are clear. No consolidation, pneumothorax, effusion or edema. The visualized skeletal structures are unremarkable. IMPRESSION: No acute cardiopulmonary disease Electronically Signed   By: Karen Kays M.D.   On: 02/16/2023 10:29    Procedures Procedures    Medications Ordered in ED Medications - No data to display  ED Course/ Medical Decision Making/ A&P                                 Medical Decision Making Patient here with 1 week history of chest congestion, productive cough diffuse headache.  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  States she felt like she was "catching a cold" last week, woke this morning with worsening chest congestion.  Denies shortness of breath, chest pain, fever and abdominal pain.  Differential would include but not limited to viral process including COVID, specifically URI, bronchitis, pneumonia, ACS and PE also considered but felt less likely given absence of chest pain, dyspnea and other  than mildly hypertensive vital signs reassuring.  Patient's complaint of dark urine possibly related to metabolic process, cystitis, pyelonephritis, kidney stone.  Patient did state that she did not take her antihypertensive medications this morning.  Amount and/or Complexity of Data Reviewed Labs: ordered.    Details: Labs interpreted by me, no evidence of leukocytosis, hemoglobin unremarkable.  Chemistries without significant change.  Urinalysis shows positive nitrites trace leukocytes many bacteria.  Urine culture is pending.  COVID test is negative. Radiology: ordered.    Details: Chest x-ray without acute cardiopulmonary  process Discussion of management or test interpretation with external provider(s): Patient here with likely URI versus bronchitis, does have acute cystitis without flank pain or evidence for pyelonephritis.  Urine cultures pending.  Will have her start Keflex prescription written.  Tessalon Perles for cough.  She will follow-up with PCP if needed.  Return precautions were given.  Risk Prescription drug management.           Final Clinical Impression(s) / ED Diagnoses Final diagnoses:  Acute cough  Acute cystitis without hematuria    Rx / DC Orders ED Discharge Orders     None         Rosey Bath 02/16/23 1125    Gerhard Munch, MD 02/17/23 1623

## 2023-02-19 ENCOUNTER — Telehealth (HOSPITAL_BASED_OUTPATIENT_CLINIC_OR_DEPARTMENT_OTHER): Payer: Self-pay | Admitting: *Deleted

## 2023-02-19 NOTE — Telephone Encounter (Signed)
Post ED Visit - Positive Culture Follow-up  Culture report reviewed by antimicrobial stewardship pharmacist: [facility] [x]  [place], [place].D. []  Celedonio Miyamoto, Pharm.D., BCPS AQ-ID []  Garvin Fila, Pharm.D., BCPS []  Georgina Pillion, Pharm.D., BCPS []  [place], [address].D., BCPS, AAHIVP []  Estella Husk, Pharm.D., BCPS, AAHIVP []  Lysle Pearl, PharmD, BCPS []  Phillips Climes, PharmD, BCPS []  Agapito Games, PharmD, BCPS []  Verlan Friends, PharmD []  Mervyn Gay, PharmD, BCPS []  Vinnie Level, PharmD  [facility] []  Len Childs, PharmD []  Greer Pickerel, PharmD []  Adalberto Cole, PharmD []  Perlie Gold, Rph []  Lonell Face) Jean Rosenthal, PharmD []  Earl Many, PharmD []  Junita Push, PharmD []  Dorna Leitz, PharmD []  Terrilee Files, PharmD []  Lynann Beaver, PharmD []  Keturah Barre, PharmD []  Loralee Pacas, PharmD []  Bernadene Person, PharmD   Positive urine culture Treated with Cephalexin, organism sensitive to the same and no further patient follow-up is required at this time.  Virl Axe [facility] 02/19/2023, 8:11 AM

## 2024-01-30 ENCOUNTER — Emergency Department (HOSPITAL_COMMUNITY)

## 2024-01-30 ENCOUNTER — Emergency Department (HOSPITAL_COMMUNITY): Admission: EM | Admit: 2024-01-30 | Discharge: 2024-01-30 | Disposition: A | Discharge status: Home / Self Care

## 2024-01-30 ENCOUNTER — Other Ambulatory Visit: Payer: Self-pay

## 2024-01-30 ENCOUNTER — Encounter (HOSPITAL_COMMUNITY): Payer: Self-pay | Admitting: Emergency Medicine

## 2024-01-30 DIAGNOSIS — R2240 Localized swelling, mass and lump, unspecified lower limb: Secondary | ICD-10-CM | POA: Diagnosis present

## 2024-01-30 DIAGNOSIS — R600 Localized edema: Secondary | ICD-10-CM | POA: Diagnosis not present

## 2024-01-30 DIAGNOSIS — R0602 Shortness of breath: Secondary | ICD-10-CM | POA: Insufficient documentation

## 2024-01-30 DIAGNOSIS — N390 Urinary tract infection, site not specified: Secondary | ICD-10-CM | POA: Diagnosis not present

## 2024-01-30 LAB — BASIC METABOLIC PANEL WITH GFR
Anion gap: 11 (ref 5–15)
BUN: 13 mg/dL (ref 6–20)
CO2: 27 mmol/L (ref 22–32)
Calcium: 9.6 mg/dL (ref 8.9–10.3)
Chloride: 101 mmol/L (ref 98–111)
Creatinine, Ser: 0.93 mg/dL (ref 0.44–1.00)
GFR, Estimated: >60 mL/min (ref >60)
Glucose, Bld: 123 mg/dL — ABNORMAL HIGH (ref 70–99)
Potassium: 3.7 mmol/L (ref 3.5–5.1)
Sodium: 139 mmol/L (ref 135–145)

## 2024-01-30 LAB — CBC WITH DIFFERENTIAL/PLATELET
Abs Immature Granulocytes: 0.03 K/uL (ref 0.00–0.07)
Basophils Absolute: 0 K/uL (ref 0.0–0.1)
Basophils Relative: 0 %
Eosinophils Absolute: 0.1 K/uL (ref 0.0–0.5)
Eosinophils Relative: 1 %
HCT: 42.2 % (ref 36.0–46.0)
Hemoglobin: 13.8 g/dL (ref 12.0–15.0)
Immature Granulocytes: 0 %
Lymphocytes Relative: 35 %
Lymphs Abs: 2.4 K/uL (ref 0.7–4.0)
MCH: 28.7 pg (ref 26.0–34.0)
MCHC: 32.7 g/dL (ref 30.0–36.0)
MCV: 87.7 fL (ref 80.0–100.0)
Monocytes Absolute: 0.6 K/uL (ref 0.1–1.0)
Monocytes Relative: 9 %
Neutro Abs: 3.7 K/uL (ref 1.7–7.7)
Neutrophils Relative %: 55 %
Platelets: 186 K/uL (ref 150–400)
RBC: 4.81 MIL/uL (ref 3.87–5.11)
RDW: 11.8 % (ref 11.5–15.5)
WBC: 6.8 K/uL (ref 4.0–10.5)
nRBC: 0 % (ref 0.0–0.2)

## 2024-01-30 LAB — URINALYSIS, ROUTINE W REFLEX MICROSCOPIC
Bilirubin Urine: NEGATIVE
Glucose, UA: NEGATIVE mg/dL
Hgb urine dipstick: NEGATIVE
Ketones, ur: NEGATIVE mg/dL
Nitrite: NEGATIVE
Protein, ur: NEGATIVE mg/dL
Specific Gravity, Urine: 1.004 — ABNORMAL LOW (ref 1.005–1.030)
pH: 6 (ref 5.0–8.0)

## 2024-01-30 LAB — BRAIN NATRIURETIC PEPTIDE: B Natriuretic Peptide: 21 pg/mL (ref 0.0–100.0)

## 2024-01-30 LAB — TROPONIN I (HIGH SENSITIVITY): Troponin I (High Sensitivity): 2 ng/L (ref <18)

## 2024-01-30 MED ORDER — CEPHALEXIN 500 MG PO CAPS: 500.0000 mg | ORAL_CAPSULE | Freq: Four times a day (QID) | ORAL | 0 refills | Status: AC

## 2024-01-30 NOTE — ED Notes (Signed)
 ED Provider at bedside.

## 2024-01-30 NOTE — ED Triage Notes (Signed)
 Pt via POV c/o bilateral foot and ankle swelling x several weeks. Pt denies pain but states her legs feel tight and like they may be full of fluid. No significant swelling noted at time of triage and pt is ambulatory without difficulty. No cardiac, pulmonary, or liver issues.

## 2024-01-30 NOTE — ED Provider Notes (Signed)
 [place] EMERGENCY DEPARTMENT AT [facility] Provider Note   CSN: [id number] Arrival date & time: 01/30/24  1056     Patient presents with: Leg Swelling   Danielle Rodriguez is a 58 y.o. female.   58 year old female presents for evaluation of bilateral ankle swelling.  States she has noticed it the last couple days.  States she is on her feet a lot at work.  She admits to some chest pain last night that has resolved at this time.  Denies any other symptoms or concerns.        Prior to Admission medications   Medication Sig Start Date End Date Taking? Authorizing Provider  cephALEXin  (KEFLEX ) 500 MG capsule Take 1 capsule (500 mg total) by mouth 4 (four) times daily. 01/30/24  Yes Michio Thier L, DO  amLODipine (NORVASC) 2.5 MG tablet Take 2.5 mg by mouth daily.    [provider]  benzonatate  (TESSALON ) 200 MG capsule Take 1 capsule (200 mg total) by mouth 3 (three) times daily as needed for cough. 02/16/23   Triplett, Tammy, PA-C  cephALEXin  (KEFLEX ) 500 MG capsule Take 1 capsule (500 mg total) by mouth 4 (four) times daily. 02/16/23   Triplett, Tammy, PA-C  Cholecalciferol (VITAMIN D3 PO) Take 50,000 Units by mouth once a week.    [provider]  hydrochlorothiazide (HYDRODIURIL) 12.5 MG tablet Take 12.5 mg by mouth daily.    [provider]  Multiple Vitamins-Minerals (MULTIVITAMIN WOMEN PO) Take 1 tablet by mouth daily.    [provider]  Zinc 50 MG TABS Take 1 tablet by mouth daily.    [provider]    Allergies: Metronidazole    Review of Systems  Constitutional:  Negative for chills and fever.  HENT:  Negative for ear pain and sore throat.   Eyes:  Negative for pain and visual disturbance.  Respiratory:  Negative for cough and shortness of breath.   Cardiovascular:  Positive for leg swelling. Negative for chest pain and palpitations.  Gastrointestinal:  Negative for abdominal pain and vomiting.  Genitourinary:   Negative for dysuria and hematuria.  Musculoskeletal:  Negative for arthralgias and back pain.  Skin:  Negative for color change and rash.  Neurological:  Negative for seizures and syncope.  All other systems reviewed and are negative.   Updated Vital Signs BP 139/81   Pulse 97   Temp 99 F (37.2 C) (Oral)   Resp 12   Ht 5' 4.5 (1.638 m)   Wt 75.3 kg   SpO2 96%   BMI 28.05 kg/m   Physical Exam Vitals and nursing note reviewed.  Constitutional:      General: She is not in acute distress.    Appearance: Normal appearance. She is well-developed. She is not ill-appearing.  HENT:     Head: Normocephalic and atraumatic.  Eyes:     Conjunctiva/sclera: Conjunctivae normal.  Cardiovascular:     Rate and Rhythm: Normal rate and regular rhythm.     Heart sounds: No murmur heard. Pulmonary:     Effort: Pulmonary effort is normal. No respiratory distress.     Breath sounds: Normal breath sounds.  Abdominal:     Palpations: Abdomen is soft.     Tenderness: There is no abdominal tenderness.  Musculoskeletal:        General: No swelling.     Cervical back: Neck supple.     Right lower leg: No edema.     Left lower leg: No edema.  Comments: Very  mild lower extremity edema  Skin:    General: Skin is warm and dry.     Capillary Refill: Capillary refill takes less than 2 seconds.  Neurological:     Mental Status: She is alert.  Psychiatric:        Mood and Affect: Mood normal.     (all labs ordered are listed, but only abnormal results are displayed) Labs Reviewed  BASIC METABOLIC PANEL WITH GFR - Abnormal; Notable for the following components:      Result Value   Glucose, Bld 123 (*)    All other components within normal limits  URINALYSIS, ROUTINE W REFLEX MICROSCOPIC - Abnormal; Notable for the following components:   Color, Urine STRAW (*)    Specific Gravity, Urine 1.004 (*)    Leukocytes,Ua SMALL (*)    Bacteria, UA MANY (*)    All other components within  normal limits  CBC WITH DIFFERENTIAL/PLATELET  BRAIN NATRIURETIC PEPTIDE  TROPONIN I (HIGH SENSITIVITY)    EKG: EKG Interpretation Date/Time:  Sunday January 30 2024 12:32:04 EDT Ventricular Rate:  82 PR Interval:  164 QRS Duration:  82 QT Interval:  366 QTC Calculation: 428 R Axis:   78  Text Interpretation: Sinus rhythm Normal intervals No STEMI No Acute change when compared to prior EKG from 10/02/2020 Confirmed by Gennaro Bouchard ([id number]) on 01/30/2024 1:01:45 PM  Radiology: [facility] Chest 1 View Result Date: 01/30/2024 CLINICAL DATA:  Shortness of breath. Foot swelling for several weeks. EXAM: CHEST  1 VIEW COMPARISON:  02/16/2023 FINDINGS: The lungs appear clear. Cardiac and mediastinal contours normal. No blunting of the costophrenic angles. IMPRESSION: 1. No active cardiopulmonary disease is radiographically apparent. Electronically Signed   By: Ryan Salvage M.D.   On: 01/30/2024 12:29     Procedures   Medications Ordered in the ED - No data to display                                  Medical Decision Making Medical Decision Making Nursing notes are reviewed. Differential diagnosis for this patient would include but not limited to: CKD, CHF, dependent edema, UTI, electrolyte abnormality, other  Cardiac monitor interpretation: Sinus rhythm, no ectopy  Emergency Department Course:  Vital signs and pulse oximetry are reviewed, evaluated by myself and found to be within normal limits prior to final disposition. Findings of laboratory testing and medical imaging are discussed with patient and family that is available. Patient agrees with the medical care plan as follows:  Patient here for very mild leg edema.  Found to have a UTI lab workup with labs are fairly unremarkable otherwise.  Will start her on Keflex .  Advise compression stockings and better shoes at work.  Advise close up with primary care doctor otherwise return to the ER for new or worsening symptoms.  Feels  comfortable being discharged home.  Problems Addressed: Leg edema: acute illness or injury Urinary tract infection without hematuria, site unspecified: acute illness or injury  Amount and/or Complexity of Data Reviewed External Data Reviewed: notes. Labs: ordered. Decision-making details documented in ED Course.    Details: Ordered and reviewed by me and fairly unremarkable except for mild UTI Radiology: ordered and independent interpretation performed. Decision-making details documented in ED Course.    Details: Ordered and interpreted independently of radiology Chest x-ray: Shows no acute abnormality in the chest ECG/medicine tests: ordered and independent interpretation performed. Decision-making details documented  in ED Course.    Details: Ordered and interpreted by me in the absence of cardiology and shows sinus rhythm, no STEMI and no acute change when compared to prior  Risk OTC drugs. Prescription drug management.     Final diagnoses:  Leg edema  Urinary tract infection without hematuria, site unspecified    ED Discharge Orders          Ordered    cephALEXin  (KEFLEX ) 500 MG capsule  4 times daily        01/30/24 1341               Hugh Kamara L, DO 01/30/24 1510

## 2024-01-30 NOTE — Discharge Instructions (Addendum)
 Wear more supportive shoes and compression stockings throughout the day and while you are at work.  Follow-up with your primary care doctor as needed.

## 2024-07-22 ENCOUNTER — Other Ambulatory Visit: Payer: Self-pay

## 2024-07-22 ENCOUNTER — Ambulatory Visit
Admission: EM | Admit: 2024-07-22 | Discharge: 2024-07-22 | Disposition: A | Discharge status: Home / Self Care | Attending: Nurse Practitioner | Admitting: Nurse Practitioner

## 2024-07-22 DIAGNOSIS — R059 Cough, unspecified: Secondary | ICD-10-CM | POA: Diagnosis not present

## 2024-07-22 MED ORDER — PREDNISONE 20 MG PO TABS: 40.0000 mg | ORAL_TABLET | Freq: Every day | ORAL | 0 refills | Status: AC

## 2024-07-22 NOTE — ED Triage Notes (Signed)
 Pt being seen in UC for nasal congestion, watery eyes, productive cough, and chest congestion for approximately 1.5 weeks. Pt reports taking otc medication with little relief. Pt denies known fever.

## 2024-07-22 NOTE — Discharge Instructions (Signed)
 Take medication as prescribed. Increase fluids and allow for plenty of rest. You may take over-the-counter Tylenol  as needed for pain, fever, or general discomfort. You may take over-the-counter Coricidin or Robitussin for your cough.  As discussed, your cough may linger from days to weeks, if you are generally feeling well, continue over-the-counter cough medications, fluids, and use of cough drops. You may find it helpful to use a humidifier in your bedroom at nighttime during sleep or sleeping elevated on pillows while symptoms persist. If symptoms begin to worsen, recommend follow-up with your primary care physician for further evaluation. Follow-up as needed.

## 2024-07-22 NOTE — ED Provider Notes (Signed)
 " [facility]    CSN: [id number] Arrival date & time: 07/22/24  1436      History   Chief Complaint Chief Complaint  Patient presents with   Nasal Congestion    HPI Danielle Rodriguez is a 59 y.o. female.   The history is provided by the patient.   Patient presents for complaints of cough, chest congestion and nasal congestion that is been present for the past 1.5 weeks.  Patient denies fever, chills, headache, ear pain, wheezing, difficulty breathing, chest pain, abdominal pain, nausea, vomiting, diarrhea, or rash.  Patient states that she did see her PCP shortly after symptoms started and she was given a round of azithromycin.  Patient states that her symptoms improved, but she states the cough continues to linger.  States I am feeling well, I just want a get rid of the cough.  States she has been taking over-the-counter medications with minimal relief.  Past Medical History:  Diagnosis Date   Headache(784.0)    Hypertension    Thyroid  disease    goiter    Patient Active Problem List   Diagnosis Date Noted   Abnormal thyroid  blood test 01/07/2022   Prediabetes 01/07/2022   Multinodular goiter 01/07/2022    Past Surgical History:  Procedure Laterality Date   HEMORROIDECTOMY     VAGINAL HYSTERECTOMY  2003    OB History     Gravida  2   Para  2   Term  2   Preterm  0   AB  0   Living  2      SAB  0   IAB  0   Ectopic  0   Multiple  0   Live Births  2            Home Medications    Prior to Admission medications  Medication Sig Start Date End Date Taking? Authorizing Provider  predniSONE  (DELTASONE ) 20 MG tablet Take 2 tablets (40 mg total) by mouth daily with breakfast for 5 days. 07/22/24 07/27/24 Yes Leath-Warren, Etta JINNY, NP  amLODipine (NORVASC) 2.5 MG tablet Take 2.5 mg by mouth daily.    [provider]  benzonatate  (TESSALON ) 200 MG capsule Take 1 capsule (200 mg total) by mouth 3 (three) times daily as needed  for cough. Patient not taking: Reported on 07/22/2024 02/16/23   Triplett, Tammy, PA-C  cephALEXin  (KEFLEX ) 500 MG capsule Take 1 capsule (500 mg total) by mouth 4 (four) times daily. Patient not taking: Reported on 07/22/2024 02/16/23   Triplett, Madelin, PA-C  cephALEXin  (KEFLEX ) 500 MG capsule Take 1 capsule (500 mg total) by mouth 4 (four) times daily. Patient not taking: Reported on 07/22/2024 01/30/24   Kammerer, Megan L, DO  Cholecalciferol (VITAMIN D3 PO) Take 50,000 Units by mouth once a week.    [provider]  hydrochlorothiazide (HYDRODIURIL) 12.5 MG tablet Take 12.5 mg by mouth daily.    [provider]  Multiple Vitamins-Minerals (MULTIVITAMIN WOMEN PO) Take 1 tablet by mouth daily.    [provider]  Zinc 50 MG TABS Take 1 tablet by mouth daily.    [provider]    Family History Family History  Problem Relation Age of Onset   Diabetes Mother    Hypertension Mother    Cancer Father    Hypertension Father    Cancer Brother 33       LUNG CANCER   Cancer Maternal Aunt    Cancer Maternal Uncle  Cancer Paternal Grandmother        BREAST   Cancer Cousin     Social History Social History[1]   Allergies   Metronidazole   Review of Systems Review of Systems Per HPI  Physical Exam Triage Vital Signs ED Triage Vitals [07/22/24 1449]  Encounter Vitals Group     BP (!) 155/98     Girls Systolic BP Percentile      Girls Diastolic BP Percentile      Boys Systolic BP Percentile      Boys Diastolic BP Percentile      Pulse Rate 87     Resp 18     Temp 98.1 F (36.7 C)     Temp Source Oral     SpO2 95 %     Weight      Height      Head Circumference      Peak Flow      Pain Score 4     Pain Loc      Pain Education      Exclude from Growth Chart    No data found.  Updated Vital Signs BP (!) 155/98 (BP Location: Right Arm)   Pulse 87   Temp 98.1 F (36.7 C) (Oral)   Resp 18   SpO2 95%   Visual Acuity Right Eye  Distance:   Left Eye Distance:   Bilateral Distance:    Right Eye Near:   Left Eye Near:    Bilateral Near:     Physical Exam Vitals and nursing note reviewed.  Constitutional:      General: She is not in acute distress.    Appearance: Normal appearance.  HENT:     Head: Normocephalic.     Right Ear: Tympanic membrane, ear canal and external ear normal.     Left Ear: Tympanic membrane, ear canal and external ear normal.     Nose: Congestion present.     Mouth/Throat:     Mouth: Mucous membranes are moist.  Eyes:     Extraocular Movements: Extraocular movements intact.     Conjunctiva/sclera: Conjunctivae normal.     Pupils: Pupils are equal, round, and reactive to light.  Cardiovascular:     Rate and Rhythm: Normal rate and regular rhythm.     Pulses: Normal pulses.     Heart sounds: Normal heart sounds.  Pulmonary:     Effort: Pulmonary effort is normal. No respiratory distress.     Breath sounds: Normal breath sounds. No stridor. No wheezing, rhonchi or rales.  Abdominal:     General: Bowel sounds are normal.     Palpations: Abdomen is soft.  Musculoskeletal:     Cervical back: Normal range of motion.  Skin:    General: Skin is warm and dry.  Neurological:     General: No focal deficit present.     Mental Status: She is alert and oriented to person, place, and time.  Psychiatric:        Mood and Affect: Mood normal.        Behavior: Behavior normal.      UC Treatments / Results  Labs (all labs ordered are listed, but only abnormal results are displayed) Labs Reviewed - No data to display  EKG   Radiology No results found.  Procedures Procedures (including critical care time)  Medications Ordered in UC Medications - No data to display  Initial Impression / Assessment and Plan / UC Course  I have reviewed the  triage vital signs and the nursing notes.  Pertinent labs & imaging results that were available during my care of the patient were reviewed by  me and considered in my medical decision making (see chart for details).  On exam, the patient's lung sounds are clear throughout, room air sats are at 95%.  Patient with persistent cough that has been present for the past 1.5 weeks.  She has been treated with an antibiotic, but cough continues to linger.  She reports feeling well, denies fever, shortness of breath, or difficulty breathing.  Will treat with prednisone  40 mg for bronchial inflammation for persistent cough.  Supportive care recommendations were provided and discussed with the patient to include fluids, rest, over-the-counter analgesics, and use of over-the-counter cough medication such as Coricidin.  Discussed indications with patient regarding follow-up.  Patient was in agreement with this plan of care and verbalizes understanding.  All questions were answered.  Patient stable for discharge.   Final Clinical Impressions(s) / UC Diagnoses   Final diagnoses:  Cough, unspecified type     Discharge Instructions      Take medication as prescribed. Increase fluids and allow for plenty of rest. You may take over-the-counter Tylenol  as needed for pain, fever, or general discomfort. You may take over-the-counter Coricidin or Robitussin for your cough.  As discussed, your cough may linger from days to weeks, if you are generally feeling well, continue over-the-counter cough medications, fluids, and use of cough drops. You may find it helpful to use a humidifier in your bedroom at nighttime during sleep or sleeping elevated on pillows while symptoms persist. If symptoms begin to worsen, recommend follow-up with your primary care physician for further evaluation. Follow-up as needed.     ED Prescriptions     Medication Sig Dispense Auth. Provider   predniSONE  (DELTASONE ) 20 MG tablet Take 2 tablets (40 mg total) by mouth daily with breakfast for 5 days. 10 tablet Leath-Warren, Etta PARAS, NP      PDMP not reviewed this  encounter.     [1]  Social History Tobacco Use   Smoking status: Never   Smokeless tobacco: Never  Vaping Use   Vaping status: Never Used  Substance Use Topics   Alcohol use: No   Drug use: No     Gilmer Etta PARAS, NP 07/22/24 1507  "
